# Patient Record
Sex: Male | Born: 1955 | Race: Black or African American | Hispanic: No | Marital: Married | Smoking: Former smoker
Health system: Southern US, Community
[De-identification: ages and names within clinical notes are randomized; demographics above are authoritative.]

## PROBLEM LIST (undated history)

## (undated) DIAGNOSIS — R0600 Dyspnea, unspecified: Secondary | ICD-10-CM

## (undated) DIAGNOSIS — I10 Essential (primary) hypertension: Secondary | ICD-10-CM

## (undated) DIAGNOSIS — I509 Heart failure, unspecified: Secondary | ICD-10-CM

## (undated) HISTORY — DX: Essential (primary) hypertension: I10

## (undated) HISTORY — PX: WISDOM TOOTH EXTRACTION: SHX21

---

## 2008-05-29 ENCOUNTER — Ambulatory Visit: Payer: Self-pay | Admitting: Diagnostic Radiology

## 2008-05-29 ENCOUNTER — Emergency Department (HOSPITAL_BASED_OUTPATIENT_CLINIC_OR_DEPARTMENT_OTHER): Admission: EM | Admit: 2008-05-29 | Discharge: 2008-05-29 | Payer: Self-pay | Admitting: Emergency Medicine

## 2010-08-15 LAB — DIFFERENTIAL
Eosinophils Relative: 2 % (ref 0–5)
Lymphocytes Relative: 16 % (ref 12–46)
Lymphs Abs: 1.3 10*3/uL (ref 0.7–4.0)

## 2010-08-15 LAB — CBC
HCT: 40.7 % (ref 39.0–52.0)
Hemoglobin: 13.3 g/dL (ref 13.0–17.0)
Platelets: 253 10*3/uL (ref 150–400)
WBC: 8.2 10*3/uL (ref 4.0–10.5)

## 2019-03-10 ENCOUNTER — Emergency Department (HOSPITAL_BASED_OUTPATIENT_CLINIC_OR_DEPARTMENT_OTHER): Payer: 59

## 2019-03-10 ENCOUNTER — Other Ambulatory Visit: Payer: Self-pay

## 2019-03-10 ENCOUNTER — Inpatient Hospital Stay (HOSPITAL_COMMUNITY): Payer: 59

## 2019-03-10 ENCOUNTER — Inpatient Hospital Stay (HOSPITAL_BASED_OUTPATIENT_CLINIC_OR_DEPARTMENT_OTHER)
Admission: EM | Admit: 2019-03-10 | Discharge: 2019-03-12 | DRG: 286 | Disposition: A | Discharge status: Home / Self Care | Payer: 59 | Attending: Internal Medicine | Admitting: Internal Medicine

## 2019-03-10 ENCOUNTER — Encounter (HOSPITAL_BASED_OUTPATIENT_CLINIC_OR_DEPARTMENT_OTHER): Payer: Self-pay | Admitting: *Deleted

## 2019-03-10 DIAGNOSIS — I429 Cardiomyopathy, unspecified: Secondary | ICD-10-CM | POA: Diagnosis present

## 2019-03-10 DIAGNOSIS — I509 Heart failure, unspecified: Secondary | ICD-10-CM | POA: Diagnosis not present

## 2019-03-10 DIAGNOSIS — I340 Nonrheumatic mitral (valve) insufficiency: Secondary | ICD-10-CM | POA: Diagnosis not present

## 2019-03-10 DIAGNOSIS — Z20828 Contact with and (suspected) exposure to other viral communicable diseases: Secondary | ICD-10-CM | POA: Diagnosis present

## 2019-03-10 DIAGNOSIS — I459 Conduction disorder, unspecified: Secondary | ICD-10-CM | POA: Diagnosis present

## 2019-03-10 DIAGNOSIS — I472 Ventricular tachycardia: Secondary | ICD-10-CM | POA: Diagnosis present

## 2019-03-10 DIAGNOSIS — I5021 Acute systolic (congestive) heart failure: Principal | ICD-10-CM | POA: Diagnosis present

## 2019-03-10 DIAGNOSIS — D509 Iron deficiency anemia, unspecified: Secondary | ICD-10-CM | POA: Diagnosis present

## 2019-03-10 DIAGNOSIS — I361 Nonrheumatic tricuspid (valve) insufficiency: Secondary | ICD-10-CM | POA: Diagnosis not present

## 2019-03-10 DIAGNOSIS — E43 Unspecified severe protein-calorie malnutrition: Secondary | ICD-10-CM | POA: Diagnosis present

## 2019-03-10 DIAGNOSIS — I493 Ventricular premature depolarization: Secondary | ICD-10-CM | POA: Diagnosis not present

## 2019-03-10 DIAGNOSIS — Z681 Body mass index (BMI) 19 or less, adult: Secondary | ICD-10-CM

## 2019-03-10 DIAGNOSIS — I5043 Acute on chronic combined systolic (congestive) and diastolic (congestive) heart failure: Secondary | ICD-10-CM | POA: Diagnosis not present

## 2019-03-10 DIAGNOSIS — F17210 Nicotine dependence, cigarettes, uncomplicated: Secondary | ICD-10-CM | POA: Diagnosis present

## 2019-03-10 DIAGNOSIS — Z880 Allergy status to penicillin: Secondary | ICD-10-CM | POA: Diagnosis not present

## 2019-03-10 DIAGNOSIS — I5041 Acute combined systolic (congestive) and diastolic (congestive) heart failure: Secondary | ICD-10-CM | POA: Diagnosis not present

## 2019-03-10 DIAGNOSIS — R1010 Upper abdominal pain, unspecified: Secondary | ICD-10-CM

## 2019-03-10 DIAGNOSIS — Z720 Tobacco use: Secondary | ICD-10-CM | POA: Diagnosis not present

## 2019-03-10 DIAGNOSIS — R109 Unspecified abdominal pain: Secondary | ICD-10-CM | POA: Diagnosis present

## 2019-03-10 HISTORY — DX: Dyspnea, unspecified: R06.00

## 2019-03-10 HISTORY — DX: Heart failure, unspecified: I50.9

## 2019-03-10 LAB — D-DIMER, QUANTITATIVE: D-Dimer, Quant: 2.02 ug/mL-FEU — ABNORMAL HIGH (ref 0.00–0.50)

## 2019-03-10 LAB — COMPREHENSIVE METABOLIC PANEL
ALT: 38 U/L (ref 0–44)
AST: 50 U/L — ABNORMAL HIGH (ref 15–41)
Albumin: 3.8 g/dL (ref 3.5–5.0)
Alkaline Phosphatase: 156 U/L — ABNORMAL HIGH (ref 38–126)
Anion gap: 10 (ref 5–15)
BUN: 11 mg/dL (ref 8–23)
CO2: 23 mmol/L (ref 22–32)
Calcium: 9.2 mg/dL (ref 8.9–10.3)
Chloride: 104 mmol/L (ref 98–111)
Creatinine, Ser: 0.89 mg/dL (ref 0.61–1.24)
GFR calc Af Amer: >60 mL/min (ref >60)
GFR calc non Af Amer: >60 mL/min (ref >60)
Glucose, Bld: 135 mg/dL — ABNORMAL HIGH (ref 70–99)
Potassium: 4.4 mmol/L (ref 3.5–5.1)
Sodium: 137 mmol/L (ref 135–145)
Total Bilirubin: 1.2 mg/dL (ref 0.3–1.2)
Total Protein: 7.4 g/dL (ref 6.5–8.1)

## 2019-03-10 LAB — CBC WITH DIFFERENTIAL/PLATELET
Abs Immature Granulocytes: 0.02 10*3/uL (ref 0.00–0.07)
Basophils Absolute: 0 10*3/uL (ref 0.0–0.1)
Basophils Relative: 0 %
Eosinophils Absolute: 0.1 10*3/uL (ref 0.0–0.5)
Eosinophils Relative: 2 %
HCT: 44.6 % (ref 39.0–52.0)
Hemoglobin: 14.3 g/dL (ref 13.0–17.0)
Immature Granulocytes: 0 %
Lymphocytes Relative: 29 %
Lymphs Abs: 2.2 10*3/uL (ref 0.7–4.0)
MCH: 31.3 pg (ref 26.0–34.0)
MCHC: 32.1 g/dL (ref 30.0–36.0)
MCV: 97.6 fL (ref 80.0–100.0)
Monocytes Absolute: 1.1 10*3/uL — ABNORMAL HIGH (ref 0.1–1.0)
Monocytes Relative: 14 %
Neutro Abs: 4.3 10*3/uL (ref 1.7–7.7)
Neutrophils Relative %: 55 %
Platelets: 201 10*3/uL (ref 150–400)
RBC: 4.57 MIL/uL (ref 4.22–5.81)
RDW: 13.1 % (ref 11.5–15.5)
WBC: 7.7 10*3/uL (ref 4.0–10.5)
nRBC: 0 % (ref 0.0–0.2)

## 2019-03-10 LAB — HIV ANTIBODY (ROUTINE TESTING W REFLEX): HIV Screen 4th Generation wRfx: NONREACTIVE

## 2019-03-10 LAB — LIPASE, BLOOD: Lipase: 26 U/L (ref 11–51)

## 2019-03-10 LAB — TROPONIN I (HIGH SENSITIVITY)
Troponin I (High Sensitivity): 17 ng/L (ref <18)
Troponin I (High Sensitivity): 19 ng/L — ABNORMAL HIGH (ref <18)

## 2019-03-10 LAB — ECHOCARDIOGRAM COMPLETE
Height: 72 in
Weight: 2139.2 oz

## 2019-03-10 LAB — SARS CORONAVIRUS 2 (TAT 6-24 HRS): SARS Coronavirus 2: NEGATIVE

## 2019-03-10 LAB — BRAIN NATRIURETIC PEPTIDE: B Natriuretic Peptide: 1080.2 pg/mL — ABNORMAL HIGH (ref 0.0–100.0)

## 2019-03-10 MED ORDER — ONDANSETRON HCL 4 MG PO TABS: 4.0000 mg | ORAL_TABLET | Freq: Four times a day (QID) | ORAL | Status: DC | PRN

## 2019-03-10 MED ORDER — FUROSEMIDE 10 MG/ML IJ SOLN
40.0000 mg | Freq: Two times a day (BID) | INTRAMUSCULAR | Status: DC
  Administered 2019-03-10 – 2019-03-11 (×2): 40 mg via INTRAVENOUS
  Filled 2019-03-10 (×2): qty 4

## 2019-03-10 MED ORDER — SODIUM CHLORIDE 0.9 % IV BOLUS
500.0000 mL | Freq: Once | INTRAVENOUS | Status: AC
  Administered 2019-03-10: 05:00:00 500 mL via INTRAVENOUS

## 2019-03-10 MED ORDER — SUCRALFATE 1 G PO TABS
1.0000 g | ORAL_TABLET | Freq: Once | ORAL | Status: DC
  Filled 2019-03-10: qty 1

## 2019-03-10 MED ORDER — ENOXAPARIN SODIUM 40 MG/0.4ML ~~LOC~~ SOLN
40.0000 mg | SUBCUTANEOUS | Status: DC
  Administered 2019-03-10 – 2019-03-11 (×2): 40 mg via SUBCUTANEOUS
  Filled 2019-03-10 (×3): qty 0.4

## 2019-03-10 MED ORDER — ONDANSETRON HCL 4 MG/2ML IJ SOLN: 4.0000 mg | Freq: Four times a day (QID) | INTRAMUSCULAR | Status: DC | PRN

## 2019-03-10 MED ORDER — IOHEXOL 350 MG/ML SOLN
100.0000 mL | Freq: Once | INTRAVENOUS | Status: AC | PRN
  Administered 2019-03-10: 07:00:00 100 mL via INTRAVENOUS

## 2019-03-10 MED ORDER — ALUM & MAG HYDROXIDE-SIMETH 200-200-20 MG/5ML PO SUSP
30.0000 mL | Freq: Once | ORAL | Status: AC
  Administered 2019-03-10: 30 mL via ORAL
  Filled 2019-03-10: qty 30

## 2019-03-10 MED ORDER — PNEUMOCOCCAL VAC POLYVALENT 25 MCG/0.5ML IJ INJ
0.5000 mL | INJECTION | INTRAMUSCULAR | Status: DC
  Filled 2019-03-10: qty 0.5

## 2019-03-10 MED ORDER — LORAZEPAM 2 MG/ML IJ SOLN
0.5000 mg | Freq: Once | INTRAMUSCULAR | Status: AC
  Administered 2019-03-10: 06:00:00 0.5 mg via INTRAVENOUS
  Filled 2019-03-10: qty 1

## 2019-03-10 MED ORDER — FUROSEMIDE 10 MG/ML IJ SOLN
40.0000 mg | Freq: Once | INTRAMUSCULAR | Status: AC
  Administered 2019-03-10: 40 mg via INTRAVENOUS
  Filled 2019-03-10: qty 4

## 2019-03-10 MED ORDER — MAGNESIUM SULFATE 2 GM/50ML IV SOLN
2.0000 g | Freq: Once | INTRAVENOUS | Status: AC
  Administered 2019-03-10: 11:00:00 2 g via INTRAVENOUS
  Filled 2019-03-10: qty 50

## 2019-03-10 MED ORDER — ENSURE ENLIVE PO LIQD
237.0000 mL | Freq: Two times a day (BID) | ORAL | Status: DC
  Administered 2019-03-11 – 2019-03-12 (×4): 237 mL via ORAL

## 2019-03-10 MED ORDER — POTASSIUM CHLORIDE CRYS ER 20 MEQ PO TBCR
20.0000 meq | EXTENDED_RELEASE_TABLET | Freq: Once | ORAL | Status: AC
  Administered 2019-03-10: 20 meq via ORAL
  Filled 2019-03-10: qty 1

## 2019-03-10 MED ORDER — SUCRALFATE 1 GM/10ML PO SUSP
ORAL | Status: AC
  Administered 2019-03-10: 1 g
  Filled 2019-03-10: qty 10

## 2019-03-10 NOTE — ED Notes (Addendum)
Pt's O2 sats were 88% on RA. MD aware and pt placed on O2 at 3l via n/c. No distress. O2 increased to 91%.

## 2019-03-10 NOTE — H&P (Signed)
[facility]                                                                                    Patient Demographics  Logan Lopez, is a 63 y.o. male  CSN: [id number]  MRN: [id number]  DOB - [birth year]  Admit Date - 03/10/2019  Outpatient Primary MD for the patient is Patient, No Pcp Per   With History of -  History reviewed. No pertinent past medical history.    History reviewed. No pertinent surgical history.  in for   Chief Complaint  Patient presents with  . abdominal pain     HPI  Logan Lopez  is a 63 y.o. male, with past medical history significant for smoking presenting with 4 to 5 days history of abdominal pain/epigastric.  Nausea of nausea vomiting or diarrhea.  Patient developed also orthopnea but no history of fever chills cough or chest pain.  Patient reports lower extremity edema and denies history of congestive heart failure and denies contact with Covid patients.  No history of alcohol abuse. Work-up in the emergency room showed pulmonary edema with small bilateral pleural effusions compatible with congestive heart failure and bibasilar airspace disease by chest x-ray, a BNP of 1080 and a flat troponin. His EKG shows sinus tach at 117 bpm with intraventricular conduction delay.    Review of Systems    In addition to the HPI above,  No Fever-chills, No Headache, No changes with Vision or hearing, No problems swallowing food or Liquids, No Chest pain,  No Nausea or Vommitting, Bowel movements are regular, No Blood in stool or Urine, No dysuria, No new skin rashes or bruises, No new joints pains-aches,  No new weakness, tingling, numbness in any extremity, No recent weight gain or loss, No polyuria, polydypsia or polyphagia, No significant Mental Stressors.  All systems were reviewed and were negative.   Social History Social History   Tobacco Use  . Smoking status: Current Every Day Smoker  . Smokeless tobacco: Never Used   Substance Use Topics  . Alcohol use: Yes    Comment: one day a week      Family History No family history on file.   Prior to Admission medications   Not on File    Allergies  Allergen Reactions  . Penicillins     Physical Exam  Vitals  Blood pressure (!) 147/97, pulse (!) 52, temperature 98.1 F (36.7 C), temperature source Oral, resp. rate (!) 21, height 6' (1.829 m), weight 60.6 kg, SpO2 97 %. General appearance, no acute distress, very pleasant HEENT no jaundice or pallor, no facial deviation or oral thrush Neck supple, no neck vein distention Chest clear and resonant  Heart normal S1-S2, positive for gallop Abdomen soft, nontender, bowel sounds present Extremities no clubbing cyanosis mild edema Neuro grossly nonfocal, pushing moving all extremities        Data Review  CBC Recent Labs  Lab 03/10/19 0513  WBC 7.7  HGB 14.3  HCT 44.6  PLT 201  MCV 97.6  MCH 31.3  MCHC 32.1  RDW 13.1  LYMPHSABS 2.2  MONOABS 1.1*  EOSABS 0.1  BASOSABS 0.0   ------------------------------------------------------------------------------------------------------------------  Chemistries  Recent Labs  Lab 03/10/19 0513  NA 137  K 4.4  CL 104  CO2 23  GLUCOSE 135*  BUN 11  CREATININE 0.89  CALCIUM 9.2  AST 50*  ALT 38  ALKPHOS 156*  BILITOT 1.2   ------------------------------------------------------------------------------------------------------------------ estimated creatinine clearance is 74.7 mL/min (by C-G formula based on SCr of 0.89 mg/dL). ------------------------------------------------------------------------------------------------------------------ No results for input(s): TSH, T4TOTAL, T3FREE, THYROIDAB in the last 72 hours.  Invalid input(s): FREET3   Coagulation profile No results for input(s): INR, PROTIME in the last 168  hours. ------------------------------------------------------------------------------------------------------------------- Recent Labs    03/10/19 0513  DDIMER 2.02*   -------------------------------------------------------------------------------------------------------------------  Cardiac Enzymes No results for input(s): CKMB, TROPONINI, MYOGLOBIN in the last 168 hours.  Invalid input(s): CK ------------------------------------------------------------------------------------------------------------------ Invalid input(s): POCBNP   ---------------------------------------------------------------------------------------------------------------  Urinalysis No results found for: COLORURINE, APPEARANCEUR, LABSPEC, PHURINE, GLUCOSEU, HGBUR, BILIRUBINUR, KETONESUR, PROTEINUR, UROBILINOGEN, NITRITE, LEUKOCYTESUR  ----------------------------------------------------------------------------------------------------------------  Imaging results:   Ct Angio Chest Pe W And/or Wo Contrast  Result Date: 03/10/2019 CLINICAL DATA:  Worsening upper abdominal pain over the past few weeks. Shortness of breath for 2 days. EXAM: CT ANGIOGRAPHY CHEST CT ABDOMEN AND PELVIS WITH CONTRAST TECHNIQUE: Multidetector CT imaging of the chest was performed using the standard protocol during bolus administration of intravenous contrast. Multiplanar CT image reconstructions and MIPs were obtained to evaluate the vascular anatomy. Multidetector CT imaging of the abdomen and pelvis was performed using the standard protocol during bolus administration of intravenous contrast. CONTRAST:  100 mL OMNIPAQUE IOHEXOL 350 MG/ML SOLN COMPARISON:  Single-view of the chest today. FINDINGS: CTA CHEST FINDINGS Cardiovascular: Satisfactory opacification of the pulmonary arteries to the segmental level. No evidence of pulmonary embolism. Marked cardiomegaly. No pericardial effusion. Mediastinum/Nodes: No enlarged mediastinal, hilar, or  axillary lymph nodes. Thyroid gland, trachea, and esophagus demonstrate no significant findings. Lungs/Pleura: Moderate bilateral pleural effusions. Ground-glass attenuation is seen throughout. No nodule, mass or consolidative process. Emphysematous change is seen in the apices. Musculoskeletal: No acute or focal abnormality. Review of the MIP images confirms the above findings. CT ABDOMEN and PELVIS FINDINGS Hepatobiliary: No focal liver abnormality is seen. No gallstones, gallbladder wall thickening, or biliary dilatation. Pancreas: Unremarkable. No pancreatic ductal dilatation or surrounding inflammatory changes. Spleen: Normal in size without focal abnormality. Adrenals/Urinary Tract: Adrenal glands are unremarkable. Kidneys are normal, without renal calculi, focal lesion, or hydronephrosis. Bladder is unremarkable. Stomach/Bowel: Stomach is within normal limits. Appendix appears normal. No evidence of bowel wall thickening, distention, or inflammatory changes. Vascular/Lymphatic: No significant vascular findings are present. No enlarged abdominal or pelvic lymph nodes. Reproductive: Prostate is unremarkable. Other: There is a small volume of free pelvic fluid. Musculoskeletal: No acute or focal bony abnormality. Review of the MIP images confirms the above findings. IMPRESSION: Ground-glass attenuation throughout both lungs is likely due to pulmonary edema in this patient with marked cardiomegaly and moderate bilateral pleural effusions. Negative for pulmonary embolus. Small volume of free pelvic fluid is nonspecific and could be due to volume overload or possibly enteritis. Bowel loops appear normal. Electronically Signed   By: Drusilla Kanner M.D.   On: 03/10/2019 07:50   Ct Abdomen Pelvis W Contrast  Result Date: 03/10/2019 CLINICAL DATA:  Worsening upper abdominal pain over the past few weeks. Shortness of breath for 2 days. EXAM: CT ANGIOGRAPHY CHEST CT ABDOMEN AND PELVIS WITH CONTRAST TECHNIQUE:  Multidetector CT imaging of the chest was performed using the standard protocol during bolus administration of intravenous contrast. Multiplanar CT image reconstructions and MIPs were obtained  to evaluate the vascular anatomy. Multidetector CT imaging of the abdomen and pelvis was performed using the standard protocol during bolus administration of intravenous contrast. CONTRAST:  100 mL OMNIPAQUE IOHEXOL 350 MG/ML SOLN COMPARISON:  Single-view of the chest today. FINDINGS: CTA CHEST FINDINGS Cardiovascular: Satisfactory opacification of the pulmonary arteries to the segmental level. No evidence of pulmonary embolism. Marked cardiomegaly. No pericardial effusion. Mediastinum/Nodes: No enlarged mediastinal, hilar, or axillary lymph nodes. Thyroid gland, trachea, and esophagus demonstrate no significant findings. Lungs/Pleura: Moderate bilateral pleural effusions. Ground-glass attenuation is seen throughout. No nodule, mass or consolidative process. Emphysematous change is seen in the apices. Musculoskeletal: No acute or focal abnormality. Review of the MIP images confirms the above findings. CT ABDOMEN and PELVIS FINDINGS Hepatobiliary: No focal liver abnormality is seen. No gallstones, gallbladder wall thickening, or biliary dilatation. Pancreas: Unremarkable. No pancreatic ductal dilatation or surrounding inflammatory changes. Spleen: Normal in size without focal abnormality. Adrenals/Urinary Tract: Adrenal glands are unremarkable. Kidneys are normal, without renal calculi, focal lesion, or hydronephrosis. Bladder is unremarkable. Stomach/Bowel: Stomach is within normal limits. Appendix appears normal. No evidence of bowel wall thickening, distention, or inflammatory changes. Vascular/Lymphatic: No significant vascular findings are present. No enlarged abdominal or pelvic lymph nodes. Reproductive: Prostate is unremarkable. Other: There is a small volume of free pelvic fluid. Musculoskeletal: No acute or focal bony  abnormality. Review of the MIP images confirms the above findings. IMPRESSION: Ground-glass attenuation throughout both lungs is likely due to pulmonary edema in this patient with marked cardiomegaly and moderate bilateral pleural effusions. Negative for pulmonary embolus. Small volume of free pelvic fluid is nonspecific and could be due to volume overload or possibly enteritis. Bowel loops appear normal. Electronically Signed   By: Inge Rise M.D.   On: 03/10/2019 07:50   Dg Chest Portable 1 View  Result Date: 03/10/2019 CLINICAL DATA:  Progressive upper abdominal pain over the last week. Shortness of breath. EXAM: PORTABLE CHEST 1 VIEW COMPARISON:  None. FINDINGS: Heart is enlarged. Mild edema is present. Small effusions are present bilaterally. Bibasilar airspace opacification is present. There is no other significant consolidation. The visualized soft tissues and bony thorax are unremarkable. IMPRESSION: 1. Cardiomegaly with mild edema and small bilateral pleural effusions compatible with congestive heart failure. 2. Bibasilar airspace disease likely reflects atelectasis. Infection is considered less likely. Electronically Signed   By: San Morelle M.D.   On: 03/10/2019 05:55    My personal review of EKG: Sinus tach at 117 bpm with intraventricular conduction delay  Assessment & Plan  Congestive heart failure/pulmonary edema Start IV Lasix Echocardiogram Accurate intake output Gallop by physical exam check echo    DVT Prophylaxis Lovenox  AM Labs Ordered, also please review Full Orders  Family Communication: Discussed with wife at bedside   code Status full  Disposition Plan: Home  Time spent in minutes : 42 minutes  Condition GUARDED   @SIGNATURE @

## 2019-03-10 NOTE — Progress Notes (Signed)
Pt was complaining of cramps to right groin and left foot briefly. Pt was able to move low extremities and get the leg and foot cramps relieved. MD notified.

## 2019-03-10 NOTE — Plan of Care (Signed)
Transfer from [facility]  Logan Lopez is a 63 year old male who presented with complaints of upper abdominal pain and subsequently shortness of breath.  Now requiring 3 L nasal cannula oxygen to maintain O2 saturations. BNP 1080.2. Imaging reveals cardiomegaly with edema. Patient started on Lasix 40 mg IV. Accepted telemetry bed as inpatient for CHF exacerbation.

## 2019-03-10 NOTE — Progress Notes (Signed)
  Echocardiogram 2D Echocardiogram has been performed.  Logan Lopez 03/10/2019, 3:51 PM

## 2019-03-10 NOTE — ED Provider Notes (Signed)
Signout from Dr. Dina Rich.  63 year old male here with upper abdominal pain and increased shortness of breath over the course of a few days.  Work-up significant for elevated BNP and chest x-ray showing pulmonary edema.  Given Lasix IV already. Physical Exam  BP 125/86   Pulse 81   Temp 98.7 F (37.1 C) (Oral)   Resp 16   Ht 6' (1.829 m)   Wt 70.3 kg   SpO2 100%   BMI 21.02 kg/m   Physical Exam  ED Course/Procedures     Procedures  MDM  Plan is to follow-up on his CT chest and CT abdomen and pelvis.  He will need admission to the medical service for further management of his new onset CHF.  CT shows some groundglass opacities consistent with pulmonary edema.  I have paged the hospitalist to discuss admission.  8:40 AM.  Discussed with Dr. Tamala Julian from [facility] who accepts the patient for admission, 9:15 AM.     Hayden Rasmussen, MD 03/10/19 [phone]

## 2019-03-10 NOTE — ED Triage Notes (Signed)
Pt c/o feeling upper abd pain for past week.states he did an e-visit and they thought he may have an ulcer. Pt feels that pain has gotten worse over the week. Describes as burning. States laying flat makes pain worse. C/o nausea. C/o having a little diarrhea. C/o sob that started 2 days ago. Worse when he lays back. Has not been eating or drinking well this week. Denies chest pain

## 2019-03-10 NOTE — ED Provider Notes (Addendum)
[facility] EM[facility]GENCY DEPARTMENT Provider Note   CSN: [id number] Arrival date & time: 03/10/19  [id number]     History   Chief Complaint Chief Complaint  Patient presents with  . abdominal pain    HPI CALOB BASKETTE is a 63 y.o. male.     HPI  This is a 63 year old male with a history of smoking who presents with abdominal pain and shortness of breath.  Patient reports 4 to 5-day history of abdominal pain.  He reports that it is still in mostly in the epigastric region.  It is worse with eating.  He has had nausea but no vomiting or diarrhea.  He had a telehealth visit and they were concerned for an ulcer.  He was started on medication but states "it made me feel funny."  Over the last 1 to 2 days he has developed shortness of breath.  Shortness of breath is worse with laying flat.  He is not had any fever, cough, or chest pain.  He has had some lower extremity swelling.  Denies any history of heart failure.  He is a current smoker.  Denies any recent sick contacts or Covid exposures.  Denies any loss of sense of taste or smell.  Patient denies any frequent alcohol use or anti-inflammatories.  History reviewed. No pertinent past medical history.  There are no active problems to display for this patient.   History reviewed. No pertinent surgical history.      Home Medications    Prior to Admission medications   Not on File    Family History No family history on file.  Social History Social History   Tobacco Use  . Smoking status: Current Every Day Smoker  . Smokeless tobacco: Never Used  Substance Use Topics  . Alcohol use: Yes    Comment: one day a week   . Drug use: Never     Allergies   Penicillins   Review of Systems Review of Systems  Constitutional: Negative for fever.  Respiratory: Positive for shortness of breath. Negative for cough and wheezing.   Cardiovascular: Positive for leg swelling. Negative for chest pain.  Gastrointestinal:  Positive for abdominal pain and nausea. Negative for constipation, diarrhea and vomiting.  Genitourinary: Negative for dysuria.  Skin: Negative for rash.  Neurological: Negative for headaches.  All other systems reviewed and are negative.    Physical Exam Updated Vital Signs BP 137/89 (BP Location: Right Arm)   Pulse (!) 122   Temp 98.7 F (37.1 C) (Oral)   Resp (!) 30   Ht 1.829 m (6')   Wt 70.3 kg   SpO2 93%   BMI 21.02 kg/m   Physical Exam Vitals signs and nursing note reviewed.  Constitutional:      Appearance: He is well-developed.     Comments: Ill-appearing but nontoxic  HENT:     Head: Normocephalic and atraumatic.     Mouth/Throat:     Mouth: Mucous membranes are dry.  Eyes:     Pupils: Pupils are equal, round, and reactive to light.  Neck:     Musculoskeletal: Neck supple.  Cardiovascular:     Rate and Rhythm: Regular rhythm. Tachycardia present.     Heart sounds: Normal heart sounds. No murmur.  Pulmonary:     Effort: Pulmonary effort is normal. No respiratory distress.     Breath sounds: Normal breath sounds. No wheezing.  Abdominal:     General: Bowel sounds are normal.     Palpations:  Abdomen is soft.     Tenderness: There is abdominal tenderness. There is no rebound.     Comments: Epigastric tenderness to palpation, no rebound or guarding  Musculoskeletal:     Right lower leg: No edema.     Left lower leg: No edema.  Lymphadenopathy:     Cervical: No cervical adenopathy.  Skin:    General: Skin is warm and dry.  Neurological:     Mental Status: He is alert and oriented to person, place, and time.  Psychiatric:     Comments: Anxious appearing      ED Treatments / Results  Labs (all labs ordered are listed, but only abnormal results are displayed) Labs Reviewed  CBC WITH DIFFERENTIAL/PLATELET - Abnormal; Notable for the following components:      Result Value   Monocytes Absolute 1.1 (*)    All other components within normal limits   SARS CORONAVIRUS 2 (TAT 6-24 HRS)  BRAIN NATRIURETIC PEPTIDE  COMPREHENSIVE METABOLIC PANEL  LIPASE, BLOOD  TROPONIN I (HIGH SENSITIVITY)    EKG EKG Interpretation  Date/Time:  Monday March 10 2019 05:01:30 EST Ventricular Rate:  117 PR Interval:    QRS Duration: 156 QT Interval:  408 QTC Calculation: 614 R Axis:   -86 Text Interpretation: Sinus tachycardia Right atrial enlargement Frequent PVCs LVH with IVCD and secondary repol abnrm Prolonged QT interval Diffuse T wave changes Confirmed by Ross Marcus ([id number]) on 03/10/2019 5:36:34 AM   Radiology No results found.  Procedures Procedures (including critical care time)  Medications Ordered in ED Medications  alum & mag hydroxide-simeth (MAALOX/MYLANTA) 200-200-20 MG/5ML suspension 30 mL (30 mLs Oral Given 03/10/19 0522)  sodium chloride 0.9 % bolus 500 mL (500 mLs Intravenous New Bag/Given 03/10/19 0521)  sucralfate (CARAFATE) 1 GM/10ML suspension (1 g  Given 03/10/19 0532)     Initial Impression / Assessment and Plan / ED Course  I have reviewed the triage vital signs and the nursing notes.  Pertinent labs & imaging results that were available during my care of the patient were reviewed by me and considered in my medical decision making (see chart for details).        Patient presents with shortness of breath and abdominal pain.  He is tachycardic and slightly tachypneic.  He is otherwise nontoxic-appearing.  He does appear anxious.  He is not febrile or hypotensive.  Given the positional nature of the shortness of breath, question whether he may have a pulmonary edema component.  However, no known history of heart failure.  Work-up initiated.  EKG shows significant ectopy with frequent PVCs.  Chest x-ray shows some cardiomegaly with mild edema suggestive of heart failure with elevated BNP.  Given his initial tachycardia, he was given a small fluid bolus as he did not appear volume overloaded.  After reviewing his  chest x-ray and lab work, he subsequently was given Lasix 40 mg.  He has been persistently tachycardic.  He was given some Ativan to address anxiety.  O2 sats have at times dipped into the mid 80s.  D-dimer was also positive.  For this reason, will obtain a CT to better characterize his full picture.  Additionally, will CT through the abdomen.  His lipase is normal and he has a slight elevation in AST and alkaline phosphatase.  Troponin is 17.  Patient will be signed out to oncoming provider. Final Clinical Impressions(s) / ED Diagnoses   Final diagnoses:  None    ED Discharge Orders    None  Shon Baton, MD 03/10/19 [id number]    Shon Baton, MD 03/10/19 [phone]

## 2019-03-10 NOTE — ED Notes (Signed)
Transported to CT 

## 2019-03-10 NOTE — ED Notes (Signed)
MD with pt  

## 2019-03-11 DIAGNOSIS — Z720 Tobacco use: Secondary | ICD-10-CM

## 2019-03-11 DIAGNOSIS — I472 Ventricular tachycardia: Secondary | ICD-10-CM

## 2019-03-11 DIAGNOSIS — I5041 Acute combined systolic (congestive) and diastolic (congestive) heart failure: Secondary | ICD-10-CM

## 2019-03-11 DIAGNOSIS — I493 Ventricular premature depolarization: Secondary | ICD-10-CM

## 2019-03-11 DIAGNOSIS — I5021 Acute systolic (congestive) heart failure: Principal | ICD-10-CM

## 2019-03-11 LAB — BASIC METABOLIC PANEL
Anion gap: 11 (ref 5–15)
BUN: 10 mg/dL (ref 8–23)
CO2: 27 mmol/L (ref 22–32)
Calcium: 9.3 mg/dL (ref 8.9–10.3)
Chloride: 102 mmol/L (ref 98–111)
Creatinine, Ser: 1.03 mg/dL (ref 0.61–1.24)
GFR calc Af Amer: >60 mL/min (ref >60)
GFR calc non Af Amer: >60 mL/min (ref >60)
Glucose, Bld: 105 mg/dL — ABNORMAL HIGH (ref 70–99)
Potassium: 4.2 mmol/L (ref 3.5–5.1)
Sodium: 140 mmol/L (ref 135–145)

## 2019-03-11 LAB — HEMOGLOBIN A1C
Hgb A1c MFr Bld: 6.1 % — ABNORMAL HIGH (ref 4.8–5.6)
Mean Plasma Glucose: 128.37 mg/dL

## 2019-03-11 LAB — MAGNESIUM: Magnesium: 2.1 mg/dL (ref 1.7–2.4)

## 2019-03-11 MED ORDER — ASPIRIN 81 MG PO CHEW
81.0000 mg | CHEWABLE_TABLET | ORAL | Status: AC
  Administered 2019-03-12: 81 mg via ORAL
  Filled 2019-03-11: qty 1

## 2019-03-11 MED ORDER — FUROSEMIDE 10 MG/ML IJ SOLN
40.0000 mg | Freq: Once | INTRAMUSCULAR | Status: AC
  Administered 2019-03-11: 40 mg via INTRAVENOUS
  Filled 2019-03-11: qty 4

## 2019-03-11 MED ORDER — AMIODARONE HCL IN DEXTROSE 360-4.14 MG/200ML-% IV SOLN
30.0000 mg/h | INTRAVENOUS | Status: DC
  Administered 2019-03-11 – 2019-03-12 (×2): 30 mg/h via INTRAVENOUS
  Filled 2019-03-11 (×2): qty 200

## 2019-03-11 MED ORDER — SODIUM CHLORIDE 0.9 % IV SOLN
INTRAVENOUS | Status: DC
  Administered 2019-03-12: 06:00:00 via INTRAVENOUS

## 2019-03-11 MED ORDER — ISOSORB DINITRATE-HYDRALAZINE 20-37.5 MG PO TABS
0.5000 | ORAL_TABLET | Freq: Three times a day (TID) | ORAL | Status: DC
  Administered 2019-03-11 – 2019-03-12 (×5): 0.5 via ORAL
  Filled 2019-03-11 (×5): qty 1

## 2019-03-11 MED ORDER — SODIUM CHLORIDE 0.9% FLUSH
3.0000 mL | Freq: Two times a day (BID) | INTRAVENOUS | Status: DC
  Administered 2019-03-11 (×2): 3 mL via INTRAVENOUS

## 2019-03-11 MED ORDER — AMIODARONE HCL IN DEXTROSE 360-4.14 MG/200ML-% IV SOLN
60.0000 mg/h | INTRAVENOUS | Status: AC
  Administered 2019-03-11: 60 mg/h via INTRAVENOUS
  Filled 2019-03-11: qty 200

## 2019-03-11 MED ORDER — SODIUM CHLORIDE 0.9% FLUSH: 3.0000 mL | INTRAVENOUS | Status: DC | PRN

## 2019-03-11 MED ORDER — SODIUM CHLORIDE 0.9 % IV SOLN: 250.0000 mL | INTRAVENOUS | Status: DC | PRN

## 2019-03-11 NOTE — Progress Notes (Signed)
PROGRESS NOTE    Logan Lopez  TGG:[id number] DOB: 11/03/1955 DOA: 03/10/2019 PCP: Patient, No Pcp Per   Brief Narrative:  Patient is a 63 year old male with no significant past medical history, smoker, who has not seen a physician for a while who presented with 4 to 5-day history of abdominal pain/epigastric pain, nausea, vomiting, orthopnea.  He also reported bilateral lower extremity edema.  Work-up in the emergency department showed pulmonary edema with small bilateral pleural effusion compatible with congestive heart failure, elevated BNP.  He was admitted for the management of possible acute congestive heart failure.  Echocardiogram done here showed ejection fraction around 25-30%.  Cardiology consulted.  Plan for right/left heart cath tomorrow.  Assessment & Plan:   Active Problems:   CHF exacerbation (HCC)   Congestive heart failure (CHF) (HCC)   Acute systolic CHF: Echocardiogram showed ejection fraction of 20 to 30%, grade 2 diastolic dysfunction.  Presented with volume overload with bilateral lower extremity edema. Elevated BNP. Pulmonary edema on chest x-ray.  Report of orthopnea.  Cardiology consulted and following.  Cardiology recommended to stop IV Lasix soon as he appears euvolemic. monitor input/output, daily weight.  Planning for right /left heart catheterization. Lower extremity edema has significantly improved.  His lungs are clear on auscultation. Plan to add BiDil.  He has been recommended LifeVest  Abdominal pain, nausea and vomiting: .  Abdomen pain,nausea,vomiting  have resolved.  PVCs: Noted to have 20-30 PVCs per hour.  Started on amnio drip.  Will monitor electrolytes  Tobacco abuse: Smokes almost 1 pack a day.  Counseled cessation.           DVT prophylaxis: Lovenox Code Status: Full code Family Communication: Discussed with wife on phone Disposition Plan: Home after cardiology clearance   Consultants: Cardiology  Procedures:  Echocardiogram  Antimicrobials:  Anti-infectives (From admission, onward)   None      Subjective: Patient seen and examined the bedside this morning.  Hemodynamically stable.  Comfortable.  He feels much better today.  Denies any abdomen pain, nausea or vomiting.  Denies any chest pain or shortness of breath.  Objective: Vitals:   03/11/19 0348 03/11/19 0820 03/11/19 1100 03/11/19 1336  BP: 136/77 114/86  99/85  Pulse: (!) 50 (!) 104  88  Resp: 20 18  18   Temp: 98.7 F (37.1 C) 98.3 F (36.8 C)  98.4 F (36.9 C)  TempSrc: Oral Oral    SpO2: 94% 100% 96% 99%  Weight:      Height:        Intake/Output Summary (Last 24 hours) at 03/11/2019 1433 Last data filed at 03/11/2019 7035 Gross per 24 hour  Intake 632.65 ml  Output 2375 ml  Net -1742.35 ml   Filed Weights   03/10/19 0453 03/10/19 1319 03/11/19 0118  Weight: 70.3 kg 60.6 kg 59.9 kg    Examination:  General exam: Appears calm and comfortable ,Not in distress, thin built HEENT:PERRL,Oral mucosa moist, Ear/Nose normal on gross exam Respiratory system: Bilateral equal air entry, normal vesicular breath sounds, no wheezes or crackles  Cardiovascular system: S1 & S2 heard, RRR. No JVD, murmurs, rubs, gallops or clicks. No pedal edema. Gastrointestinal system: Abdomen is nondistended, soft and nontender. No organomegaly or masses felt. Normal bowel sounds heard. Central nervous system: Alert and oriented. No focal neurological deficits. Extremities: No edema, no clubbing ,no cyanosis, distal peripheral pulses palpable. Skin: No rashes, lesions or ulcers,no icterus ,no pallor   Data Reviewed: I have personally reviewed following labs  and imaging studies  CBC: Recent Labs  Lab 03/10/19 0513  WBC 7.7  NEUTROABS 4.3  HGB 14.3  HCT 44.6  MCV 97.6  PLT 201   Basic Metabolic Panel: Recent Labs  Lab 03/10/19 0513 03/11/19 0424  NA 137 140  K 4.4 4.2  CL 104 102  CO2 23 27  GLUCOSE 135* 105*  BUN 11 10   CREATININE 0.89 1.03  CALCIUM 9.2 9.3  MG  --  2.1   GFR: Estimated Creatinine Clearance: 63.8 mL/min (by C-G formula based on SCr of 1.03 mg/dL). Liver Function Tests: Recent Labs  Lab 03/10/19 0513  AST 50*  ALT 38  ALKPHOS 156*  BILITOT 1.2  PROT 7.4  ALBUMIN 3.8   Recent Labs  Lab 03/10/19 0513  LIPASE 26   No results for input(s): AMMONIA in the last 168 hours. Coagulation Profile: No results for input(s): INR, PROTIME in the last 168 hours. Cardiac Enzymes: No results for input(s): CKTOTAL, CKMB, CKMBINDEX, TROPONINI in the last 168 hours. BNP (last 3 results) No results for input(s): PROBNP in the last 8760 hours. HbA1C: No results for input(s): HGBA1C in the last 72 hours. CBG: No results for input(s): GLUCAP in the last 168 hours. Lipid Profile: No results for input(s): CHOL, HDL, LDLCALC, TRIG, CHOLHDL, LDLDIRECT in the last 72 hours. Thyroid Function Tests: No results for input(s): TSH, T4TOTAL, FREET4, T3FREE, THYROIDAB in the last 72 hours. Anemia Panel: No results for input(s): VITAMINB12, FOLATE, FERRITIN, TIBC, IRON, RETICCTPCT in the last 72 hours. Sepsis Labs: No results for input(s): PROCALCITON, LATICACIDVEN in the last 168 hours.  Recent Results (from the past 240 hour(s))  SARS CORONAVIRUS 2 (TAT 6-24 HRS) Nasopharyngeal Nasopharyngeal Swab     Status: None   Collection Time: 03/10/19  5:34 AM   Specimen: Nasopharyngeal Swab  Result Value Ref Range Status   SARS Coronavirus 2 NEGATIVE NEGATIVE Final    Comment: (NOTE) SARS-CoV-2 target nucleic acids are NOT DETECTED. The SARS-CoV-2 RNA is generally detectable in upper and lower respiratory specimens during the acute phase of infection. Negative results do not preclude SARS-CoV-2 infection, do not rule out co-infections with other pathogens, and should not be used as the sole basis for treatment or other patient management decisions. Negative results must be combined with clinical  observations, patient history, and epidemiological information. The expected result is Negative. Fact Sheet for Patients: HairSlick.nohttps://www.fda.gov/media/138098/download Fact Sheet for Healthcare Providers: quierodirigir.comhttps://www.fda.gov/media/138095/download This test is not yet approved or cleared by the [place] FDA and  has been authorized for detection and/or diagnosis of SARS-CoV-2 by FDA under an Emergency Use Authorization (EUA). This EUA will remain  in effect (meaning this test can be used) for the duration of the COVID-19 declaration under Section 56 4(b)(1) of the Act, 21 U.S.C. section 360bbb-3(b)(1), unless the authorization is terminated or revoked sooner. Performed at [facility], [address] [place], [place] [address]          Radiology Studies: Ct Angio Chest Pe W And/or Wo Contrast  Result Date: 03/10/2019 CLINICAL DATA:  Worsening upper abdominal pain over the past few weeks. Shortness of breath for 2 days. EXAM: CT ANGIOGRAPHY CHEST CT ABDOMEN AND PELVIS WITH CONTRAST TECHNIQUE: Multidetector CT imaging of the chest was performed using the standard protocol during bolus administration of intravenous contrast. Multiplanar CT image reconstructions and MIPs were obtained to evaluate the vascular anatomy. Multidetector CT imaging of the abdomen and pelvis was performed using the standard protocol during  bolus administration of intravenous contrast. CONTRAST:  100 mL OMNIPAQUE IOHEXOL 350 MG/ML SOLN COMPARISON:  Single-view of the chest today. FINDINGS: CTA CHEST FINDINGS Cardiovascular: Satisfactory opacification of the pulmonary arteries to the segmental level. No evidence of pulmonary embolism. Marked cardiomegaly. No pericardial effusion. Mediastinum/Nodes: No enlarged mediastinal, hilar, or axillary lymph nodes. Thyroid gland, trachea, and esophagus demonstrate no significant findings. Lungs/Pleura: Moderate bilateral pleural effusions. Ground-glass attenuation is seen  throughout. No nodule, mass or consolidative process. Emphysematous change is seen in the apices. Musculoskeletal: No acute or focal abnormality. Review of the MIP images confirms the above findings. CT ABDOMEN and PELVIS FINDINGS Hepatobiliary: No focal liver abnormality is seen. No gallstones, gallbladder wall thickening, or biliary dilatation. Pancreas: Unremarkable. No pancreatic ductal dilatation or surrounding inflammatory changes. Spleen: Normal in size without focal abnormality. Adrenals/Urinary Tract: Adrenal glands are unremarkable. Kidneys are normal, without renal calculi, focal lesion, or hydronephrosis. Bladder is unremarkable. Stomach/Bowel: Stomach is within normal limits. Appendix appears normal. No evidence of bowel wall thickening, distention, or inflammatory changes. Vascular/Lymphatic: No significant vascular findings are present. No enlarged abdominal or pelvic lymph nodes. Reproductive: Prostate is unremarkable. Other: There is a small volume of free pelvic fluid. Musculoskeletal: No acute or focal bony abnormality. Review of the MIP images confirms the above findings. IMPRESSION: Ground-glass attenuation throughout both lungs is likely due to pulmonary edema in this patient with marked cardiomegaly and moderate bilateral pleural effusions. Negative for pulmonary embolus. Small volume of free pelvic fluid is nonspecific and could be due to volume overload or possibly enteritis. Bowel loops appear normal. Electronically Signed   By: Inge Rise M.D.   On: 03/10/2019 07:50   Ct Abdomen Pelvis W Contrast  Result Date: 03/10/2019 CLINICAL DATA:  Worsening upper abdominal pain over the past few weeks. Shortness of breath for 2 days. EXAM: CT ANGIOGRAPHY CHEST CT ABDOMEN AND PELVIS WITH CONTRAST TECHNIQUE: Multidetector CT imaging of the chest was performed using the standard protocol during bolus administration of intravenous contrast. Multiplanar CT image reconstructions and MIPs were  obtained to evaluate the vascular anatomy. Multidetector CT imaging of the abdomen and pelvis was performed using the standard protocol during bolus administration of intravenous contrast. CONTRAST:  100 mL OMNIPAQUE IOHEXOL 350 MG/ML SOLN COMPARISON:  Single-view of the chest today. FINDINGS: CTA CHEST FINDINGS Cardiovascular: Satisfactory opacification of the pulmonary arteries to the segmental level. No evidence of pulmonary embolism. Marked cardiomegaly. No pericardial effusion. Mediastinum/Nodes: No enlarged mediastinal, hilar, or axillary lymph nodes. Thyroid gland, trachea, and esophagus demonstrate no significant findings. Lungs/Pleura: Moderate bilateral pleural effusions. Ground-glass attenuation is seen throughout. No nodule, mass or consolidative process. Emphysematous change is seen in the apices. Musculoskeletal: No acute or focal abnormality. Review of the MIP images confirms the above findings. CT ABDOMEN and PELVIS FINDINGS Hepatobiliary: No focal liver abnormality is seen. No gallstones, gallbladder wall thickening, or biliary dilatation. Pancreas: Unremarkable. No pancreatic ductal dilatation or surrounding inflammatory changes. Spleen: Normal in size without focal abnormality. Adrenals/Urinary Tract: Adrenal glands are unremarkable. Kidneys are normal, without renal calculi, focal lesion, or hydronephrosis. Bladder is unremarkable. Stomach/Bowel: Stomach is within normal limits. Appendix appears normal. No evidence of bowel wall thickening, distention, or inflammatory changes. Vascular/Lymphatic: No significant vascular findings are present. No enlarged abdominal or pelvic lymph nodes. Reproductive: Prostate is unremarkable. Other: There is a small volume of free pelvic fluid. Musculoskeletal: No acute or focal bony abnormality. Review of the MIP images confirms the above findings. IMPRESSION: Ground-glass attenuation throughout both lungs is  likely due to pulmonary edema in this patient with  marked cardiomegaly and moderate bilateral pleural effusions. Negative for pulmonary embolus. Small volume of free pelvic fluid is nonspecific and could be due to volume overload or possibly enteritis. Bowel loops appear normal. Electronically Signed   By: Drusilla Kanner M.D.   On: 03/10/2019 07:50   Dg Chest Portable 1 View  Result Date: 03/10/2019 CLINICAL DATA:  Progressive upper abdominal pain over the last week. Shortness of breath. EXAM: PORTABLE CHEST 1 VIEW COMPARISON:  None. FINDINGS: Heart is enlarged. Mild edema is present. Small effusions are present bilaterally. Bibasilar airspace opacification is present. There is no other significant consolidation. The visualized soft tissues and bony thorax are unremarkable. IMPRESSION: 1. Cardiomegaly with mild edema and small bilateral pleural effusions compatible with congestive heart failure. 2. Bibasilar airspace disease likely reflects atelectasis. Infection is considered less likely. Electronically Signed   By: Marin Roberts M.D.   On: 03/10/2019 05:55        Scheduled Meds:  enoxaparin (LOVENOX) injection  40 mg Subcutaneous Q24H   feeding supplement (ENSURE ENLIVE)  237 mL Oral BID BM   isosorbide-hydrALAZINE  0.5 tablet Oral TID   pneumococcal 23 valent vaccine  0.5 mL Intramuscular Tomorrow-1000   sodium chloride flush  3 mL Intravenous Q12H   Continuous Infusions:  amiodarone 60 mg/hr (03/11/19 1102)   Followed by   amiodarone       LOS: 1 day    Time spent: 35 mins.More than 50% of that time was spent in counseling and/or coordination of care.      Burnadette Pop, MD [facility] Pager [phone]  If 7PM-7AM, please contact night-coverage [email or web address] Password [facility] 03/11/2019, 2:33 PM

## 2019-03-11 NOTE — Progress Notes (Signed)
Patient is having episodes of wide QRS, MD notified.

## 2019-03-11 NOTE — Progress Notes (Signed)
  Ordered faxed for Life Vest at discharge with frequent PVC/NSVT and low EF.   I called Life Vest Rep.   Amy Clegg NP-C  12:31 PM

## 2019-03-11 NOTE — Progress Notes (Signed)
  Amiodarone Drug - Drug Interaction Consult Note  Recommendations: -None - watch QTc if prn ondansetron used frequently (not administered at all yet)  Amiodarone is metabolized by the cytochrome P450 system and therefore has the potential to cause many drug interactions. Amiodarone has an average plasma half-life of 50 days (range 20 to 100 days).   There is potential for drug interactions to occur several weeks or months after stopping treatment and the onset of drug interactions may be slow after initiating amiodarone.   []  Statins: Increased risk of myopathy. Simvastatin- restrict dose to 20mg  daily. Other statins: counsel patients to report any muscle pain or weakness immediately.  []  Anticoagulants: Amiodarone can increase anticoagulant effect. Consider warfarin dose reduction. Patients should be monitored closely and the dose of anticoagulant altered accordingly, remembering that amiodarone levels take several weeks to stabilize.  []  Antiepileptics: Amiodarone can increase plasma concentration of phenytoin, the dose should be reduced. Note that small changes in phenytoin dose can result in large changes in levels. Monitor patient and counsel on signs of toxicity.  []  Beta blockers: increased risk of bradycardia, AV block and myocardial depression. Sotalol - avoid concomitant use.  []   Calcium channel blockers (diltiazem and verapamil): increased risk of bradycardia, AV block and myocardial depression.  []   Cyclosporine: Amiodarone increases levels of cyclosporine. Reduced dose of cyclosporine is recommended.  []  Digoxin dose should be halved when amiodarone is started.  []  Diuretics: increased risk of cardiotoxicity if hypokalemia occurs.  []  Oral hypoglycemic agents (glyburide, glipizide, glimepiride): increased risk of hypoglycemia. Patient's glucose levels should be monitored closely when initiating amiodarone therapy.   [x]  Drugs that prolong the QT interval:  Torsades de  pointes risk may be increased with concurrent use - avoid if possible.  Monitor QTc, also keep magnesium/potassium WNL if concurrent therapy can't be avoided. Marland Kitchen Antibiotics: e.g. fluoroquinolones, erythromycin. . Antiarrhythmics: e.g. quinidine, procainamide, disopyramide, sotalol. . Antipsychotics: e.g. phenothiazines, haloperidol.  . Lithium, tricyclic antidepressants, and methadone. Thank Concha Pyo  03/11/2019 11:09 AM

## 2019-03-11 NOTE — Progress Notes (Signed)
Patient is having frequents wide QRS's, NP Amy notified.

## 2019-03-11 NOTE — Consult Note (Addendum)
Advanced Heart Failure Team Consult Note   Primary Physician: Patient, No Pcp Per PCP-Cardiologist:  No primary care provider on file.  Reason for Consultation: Acute Systolic Heart Failure   HPI:    Logan Lopez is seen today for evaluation of new acute systolic heart failure  at the request of Dr Renford Dills.   [name] is a 63 year old with a history of iron deficient anemia, smoker ( 1 pack every 2 days ) and no other medical  Issues. No history of cardiac disease. Drinks wine coolers on the weekend only 5-6. 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   He has not been seen by PCP in years. About 1 week ago he had some abdominal discomfort. He had a telehealth and was started on protonix. His symptoms worsened and he developed shortness of breath. He had to sleep sitting up right or in a kneeling prosthion.  Says he has been light headed every now and then.   Presented to [facility] with increased shortness of breath. CXR showed showed bilateral effusions. CTA was negative PE. CT of abd- ground glass throughout both lungs possible due to pulmonary edema. Echo this admit showed EF 25-30% . He was started IV lasix. Negative > 3 liters oxygen. BNP on admit 1080, HS-Trop negative, creatinine 0.89.   Review of Systems: [y] = yes, [ ]  = no    General: Weight gain [ ] ; Weight loss [ ] ; Anorexia [ ] ; Fatigue [Y ]; Fever [ ] ; Chills [ ] ; Weakness [Y ]   Cardiac: Chest pain/pressure [ ] ; Resting SOB [ ] ; Exertional SOB [Y ]; Orthopnea [ ] ; Pedal Edema [ ] ; Palpitations [ ] ; Syncope [ ] ; Presyncope [ Y]; Paroxysmal nocturnal dyspnea[ ]    Pulmonary: Cough [ ] ; Wheezing[ ] ; Hemoptysis[ ] ; Sputum [ ] ; Snoring [ ]    GI: Vomiting[ ] ; Dysphagia[ ] ; Melena[ ] ; Hematochezia [ ] ; Heartburn[ ] ; Abdominal pain [ ] ; Constipation [ ] ; Diarrhea [ ] ; BRBPR [ ]    GU: Hematuria[ ] ; Dysuria [ ] ; Nocturia[ ]    Vascular: Pain in legs with walking [ ] ; Pain in feet with lying flat [ ] ;  Non-healing sores [ ] ; Stroke [ ] ; TIA [ ] ; Slurred speech [ ] ;   Neuro: Headaches[ ] ; Vertigo[ ] ; Seizures[ ] ; Paresthesias[ ] ;Blurred vision [ ] ; Diplopia [ ] ; Vision changes [ ]    Ortho/Skin: Arthritis [ ] ; Joint pain [ ] ; Muscle pain [ ] ; Joint swelling [ ] ; Back Pain [ ] ; Rash [ ]    Psych: Depression[ ] ; Anxiety[ ]    Heme: Bleeding problems [ ] ; Clotting disorders [ ] ; Anemia [ ]    Endocrine: Diabetes [ ] ; Thyroid dysfunction[ ]   Home Medications Prior to Admission medications   Medication Sig Start Date End Date Taking? Authorizing Provider  acetaminophen (TYLENOL) 500 MG tablet Take 1,000 mg by mouth every 6 (six) hours as needed for mild pain.   Yes [provider]  Chlorphen-Phenyleph-ASA (ALKA-SELTZER PLUS COLD) 2-7.8-325 MG TBEF Take 2 tablets by mouth every 6 (six) hours as needed (cold).   Yes [provider]  DM-Phenylephrine-Acetaminophen (THERAFLU EXPRESSMAX) 10-5-325 MG/15ML LIQD Take 15 mLs by mouth every 8 (eight) hours as needed (cold symptoms).   Yes [provider]  ferrous sulfate 325 (65 FE) MG tablet Take 325 mg by mouth daily with breakfast.   Yes [provider]  ibuprofen (ADVIL) 200 MG tablet Take 400 mg by mouth every 6 (six)  hours as needed for moderate pain.   Yes [provider]  pantoprazole (PROTONIX) 40 MG tablet Take 40 mg by mouth daily.   Yes [provider]  vitamin C (ASCORBIC ACID) 500 MG tablet Take 500 mg by mouth daily.   Yes [provider]    Past Medical History: Past Medical History:  Diagnosis Date   CHF (congestive heart failure) (HCC)    new 03/2019   Dyspnea     Past Surgical History: Past Surgical History:  Procedure Laterality Date   WISDOM TOOTH EXTRACTION      Family History: No family history of sudden cardiac death or coronary disease.  Social History: Social History   Socioeconomic History   Marital status: Married    Spouse name: Not on file    Number of children: Not on file   Years of education: Not on file   Highest education level: Not on file  Occupational History   Not on file  Social Needs   Financial resource strain: Not on file   Food insecurity    Worry: Not on file    Inability: Not on file   Transportation needs    Medical: Not on file    Non-medical: Not on file  Tobacco Use   Smoking status: Current Every Day Smoker    Years: 9.00    Types: Cigarettes   Smokeless tobacco: Never Used  Substance and Sexual Activity   Alcohol use: Yes    Comment: one day a week    Drug use: Never   Sexual activity: Not on file  Lifestyle   Physical activity    Days per week: Not on file    Minutes per session: Not on file   Stress: Not on file  Relationships   Social connections    Talks on phone: Not on file    Gets together: Not on file    Attends religious service: Not on file    Active member of club or organization: Not on file    Attends meetings of clubs or organizations: Not on file    Relationship status: Not on file  Other Topics Concern   Not on file  Social History Narrative   Not on file    Allergies:  Allergies  Allergen Reactions   Penicillins     Did it involve swelling of the face/tongue/throat, SOB, or low BP? No Did it involve sudden or severe rash/hives, skin peeling, or any reaction on the inside of your mouth or nose? yes Did you need to seek medical attention at a hospital or doctor's office? No When did it last happen? If all above answers are NO, may proceed with cephalosporin use.    Objective:    Vital Signs:   Temp:  [98 F (36.7 C)-98.7 F (37.1 C)] 98.3 F (36.8 C) (11/10 0820) Pulse Rate:  [46-104] 104 (11/10 0820) Resp:  [18-21] 18 (11/10 0820) BP: (102-150)/(71-103) 114/86 (11/10 0820) SpO2:  [94 %-100 %] 100 % (11/10 0820) Weight:  [59.9 kg-60.6 kg] 59.9 kg (11/10 0118) Last BM Date: 03/10/19  Weight change: Filed Weights   03/10/19  0453 03/10/19 1319 03/11/19 0118  Weight: 70.3 kg 60.6 kg 59.9 kg    Intake/Output:   Intake/Output Summary (Last 24 hours) at 03/11/2019 0948 Last data filed at 03/11/2019 0823 Gross per 24 hour  Intake 772.65 ml  Output 2950 ml  Net -2177.35 ml      Physical Exam    General:  Thin sitting on the side of the bed. No resp difficulty HEENT: normal Neck: supple. JVP 5-6 . Carotids 2+ bilat; no bruits. No lymphadenopathy or thyromegaly appreciated. Cor: PMI nondisplaced. Irregular rate & rhythm. No rubs, gallops or murmurs. Lungs: clear Abdomen: soft, nontender, nondistended. No hepatosplenomegaly. No bruits or masses. Good bowel sounds. Extremities: no cyanosis, clubbing, rash, edema.  Neuro: alert & orientedx3, cranial nerves grossly intact. moves all 4 extremities w/o difficulty. Affect pleasant   Telemetry   NSR with frequent PVCs > 20-30 per minutes.   EKG    Sinus Tach 117 frequent PVCs. QRS 156 ms.   Labs   Basic Metabolic Panel: Recent Labs  Lab 03/10/19 0513 03/11/19 0424  NA 137 140  K 4.4 4.2  CL 104 102  CO2 23 27  GLUCOSE 135* 105*  BUN 11 10  CREATININE 0.89 1.03  CALCIUM 9.2 9.3    Liver Function Tests: Recent Labs  Lab 03/10/19 0513  AST 50*  ALT 38  ALKPHOS 156*  BILITOT 1.2  PROT 7.4  ALBUMIN 3.8   Recent Labs  Lab 03/10/19 0513  LIPASE 26   No results for input(s): AMMONIA in the last 168 hours.  CBC: Recent Labs  Lab 03/10/19 0513  WBC 7.7  NEUTROABS 4.3  HGB 14.3  HCT 44.6  MCV 97.6  PLT 201    Cardiac Enzymes: No results for input(s): CKTOTAL, CKMB, CKMBINDEX, TROPONINI in the last 168 hours.  BNP: BNP (last 3 results) Recent Labs    03/10/19 0513  BNP 1,080.2*    ProBNP (last 3 results) No results for input(s): PROBNP in the last 8760 hours.   CBG: No results for input(s): GLUCAP in the last 168 hours.  Coagulation Studies: No results for input(s): LABPROT, INR in the last 72 hours.   Imaging      No results found.   Medications:     Current Medications:  enoxaparin (LOVENOX) injection  40 mg Subcutaneous Q24H   feeding supplement (ENSURE ENLIVE)  237 mL Oral BID BM   furosemide  40 mg Intravenous Q12H   pneumococcal 23 valent vaccine  0.5 mL Intramuscular Tomorrow-1000     Infusions:      Assessment/Plan   1. Acute Systolic Heart Failure ECHO this admit 25-30%. HS Trop was not elevated. No cardiac history. No family history of heart disease.  - Will need cath to further evaluate. Set up LHC/RHC for tomorrow. This could possibly be from high PVC burden.  - Volume status improved. Stop IV lasix. Appears  Euvolemic.  - Hold diuretics until and see what RHC shows. - Doubt he will need daily diuretics. 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    - Anticipate adding BIDIL tomorrow. I dont think entresto would be the best option for afterload.  - Hold off on bb. Adding amio for PVC suppression.   2. PVC 20-30 PVCs per hour.  Start amio drip to suppress PVCs. Check Mag. Keep K >4.   3. Tobacco Abuse.  Discussed smoking cessation.   Set RHC/LHC for tomorrow. Start on amiodarone drip to suppress PVCs.  Consult cardiac rehab.   Length of Stay: 1  Amy Clegg, NP  03/11/2019, 9:48 AM  Advanced Heart Failure Team Pager [phone] (M-F; 7a - 4p)  Please contact [facility] for night-coverage after hours (4p -7a ) and weekends on [email or web address]  Patient seen with NP, agree with the  above note.   No prior cardiac history.  He smokes about 1/2 ppd and has a history of Fe deficiency anemia.  Not a heavy drinker.  No FH of cardiomyopathy.  About a week ago, he developed progressive exertional dyspnea and orthopnea, no chest pain. He was admitted with evidence for CHF.  Echo showed EF 25-30% with severe LV dilation and normal RV.  Telemetry shows very frequent PVCs and runs of NSVT.  He has diuresed  well so far with IV Lasix.   General: NAD Neck: JVP 9-10 cm, no thyromegaly or thyroid nodule.  Lungs: Clear to auscultation bilaterally with normal respiratory effort. CV: Lateral PMI.  Heart regular S1/S2, no S3/S4, no murmur.  No peripheral edema.  No carotid bruit.  Normal pedal pulses.  Abdomen: Soft, nontender, no hepatosplenomegaly, no distention.  Skin: Intact without lesions or rashes.  Neurologic: Alert and oriented x 3.  Psych: Normal affect. Extremities: No clubbing or cyanosis.  HEENT: Normal.   1. Acute systolic CHF: Newly noted cardiomyopathy with severe LV dilation, EF 25-30%, RV ok.  He was admitted with 1 week of exertional dyspnea/orthopnea, has diuresed well here and feels better.  No family history of cardiomyopathy.  No history of CAD or chest pain.  No definite pre-existing viral symptoms.  It is possible that this could be a PVC-mediated CMP (or that the cardiomyopathy itself could be driving the PVCs).  On exam, he remains mildly volume overloaded.  - Lasix 40 mg IV one more dose this evening, probably to po tomorrow.  - Add Bidil 1/2 tab tid.  - Suppress PVCs/NSVT => amiodarone gtt, see below.  - Spironolactone if tolerates the above with no problems.  - Will arrange for RHC/LHC tomorrow to rule out CAD and assess filling pressures/CO.  2. PVCs/NSVT: Very frequent.  ?Cause of cardiomyopathy versus result of cardiomyopathy.   - Amiodarone gtt for now.  - Will need Lifevest at discharge.  - Keep up K and Mg.  3. Smoking: I strongly recommended that he quit.   Marca AnconaDalton Shelbia Scinto 03/11/2019 12:05 PM

## 2019-03-11 NOTE — H&P (View-Only) (Signed)
Advanced Heart Failure Team Consult Note   Primary Physician: Logan Lopez, No Pcp Per PCP-Cardiologist:  No primary care provider on file.  Reason for Consultation: Acute Systolic Heart Failure   HPI:    Logan Lopez is seen today for evaluation of new acute systolic heart failure  at the request of Dr Renford Dills.   Logan Lopez is a 63 year old with a history of iron deficient anemia, smoker ( 1 pack every 2 days ) and no other medical  Issues. No history of cardiac disease. Drinks wine coolers on the weekend only 5-6. 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   He has not been seen by PCP in years. About 1 week ago he had some abdominal discomfort. He had a telehealth and was started on protonix. His symptoms worsened and he developed shortness of breath. He had to sleep sitting up right or in a kneeling prosthion.  Says he has been light headed every now and then.   Presented to [facility] with increased shortness of breath. CXR showed showed bilateral effusions. CTA was negative PE. CT of abd- ground glass throughout both lungs possible due to pulmonary edema. Echo this admit showed EF 25-30% . He was started IV lasix. Negative > 3 liters oxygen. BNP on admit 1080, HS-Trop negative, creatinine 0.89.   Review of Systems: [y] = yes, [ ]  = no    General: Weight gain [ ] ; Weight loss [ ] ; Anorexia [ ] ; Fatigue [Y ]; Fever [ ] ; Chills [ ] ; Weakness [Y ]   Cardiac: Chest pain/pressure [ ] ; Resting SOB [ ] ; Exertional SOB [Y ]; Orthopnea [ ] ; Pedal Edema [ ] ; Palpitations [ ] ; Syncope [ ] ; Presyncope [ Y]; Paroxysmal nocturnal dyspnea[ ]    Pulmonary: Cough [ ] ; Wheezing[ ] ; Hemoptysis[ ] ; Sputum [ ] ; Snoring [ ]    GI: Vomiting[ ] ; Dysphagia[ ] ; Melena[ ] ; Hematochezia [ ] ; Heartburn[ ] ; Abdominal pain [ ] ; Constipation [ ] ; Diarrhea [ ] ; BRBPR [ ]    GU: Hematuria[ ] ; Dysuria [ ] ; Nocturia[ ]    Vascular: Pain in legs with walking [ ] ; Pain in feet with lying flat [ ] ;  Non-healing sores [ ] ; Stroke [ ] ; TIA [ ] ; Slurred speech [ ] ;   Neuro: Headaches[ ] ; Vertigo[ ] ; Seizures[ ] ; Paresthesias[ ] ;Blurred vision [ ] ; Diplopia [ ] ; Vision changes [ ]    Ortho/Skin: Arthritis [ ] ; Joint pain [ ] ; Muscle pain [ ] ; Joint swelling [ ] ; Back Pain [ ] ; Rash [ ]    Psych: Depression[ ] ; Anxiety[ ]    Heme: Bleeding problems [ ] ; Clotting disorders [ ] ; Anemia [ ]    Endocrine: Diabetes [ ] ; Thyroid dysfunction[ ]   Home Medications Prior to Admission medications   Medication Sig Start Date End Date Taking? Authorizing Provider  acetaminophen (TYLENOL) 500 MG tablet Take 1,000 mg by mouth every 6 (six) hours as needed for mild pain.   Yes [provider]  Chlorphen-Phenyleph-ASA (ALKA-SELTZER PLUS COLD) 2-7.8-325 MG TBEF Take 2 tablets by mouth every 6 (six) hours as needed (cold).   Yes [provider]  DM-Phenylephrine-Acetaminophen (THERAFLU EXPRESSMAX) 10-5-325 MG/15ML LIQD Take 15 mLs by mouth every 8 (eight) hours as needed (cold symptoms).   Yes [provider]  ferrous sulfate 325 (65 FE) MG tablet Take 325 mg by mouth daily with breakfast.   Yes [provider]  ibuprofen (ADVIL) 200 MG tablet Take 400 mg by mouth every 6 (six)  hours as needed for moderate pain.   Yes [provider]  pantoprazole (PROTONIX) 40 MG tablet Take 40 mg by mouth daily.   Yes [provider]  vitamin C (ASCORBIC ACID) 500 MG tablet Take 500 mg by mouth daily.   Yes [provider]    Past Medical History: Past Medical History:  Diagnosis Date   CHF (congestive heart failure) (HCC)    new 03/2019   Dyspnea     Past Surgical History: Past Surgical History:  Procedure Laterality Date   WISDOM TOOTH EXTRACTION      Family History: No family history of sudden cardiac death or coronary disease.  Social History: Social History   Socioeconomic History   Marital status: Married    Spouse name: Not on file    Number of children: Not on file   Years of education: Not on file   Highest education level: Not on file  Occupational History   Not on file  Social Needs   Financial resource strain: Not on file   Food insecurity    Worry: Not on file    Inability: Not on file   Transportation needs    Medical: Not on file    Non-medical: Not on file  Tobacco Use   Smoking status: Current Every Day Smoker    Years: 9.00    Types: Cigarettes   Smokeless tobacco: Never Used  Substance and Sexual Activity   Alcohol use: Yes    Comment: one day a week    Drug use: Never   Sexual activity: Not on file  Lifestyle   Physical activity    Days per week: Not on file    Minutes per session: Not on file   Stress: Not on file  Relationships   Social connections    Talks on phone: Not on file    Gets together: Not on file    Attends religious service: Not on file    Active member of club or organization: Not on file    Attends meetings of clubs or organizations: Not on file    Relationship status: Not on file  Other Topics Concern   Not on file  Social History Narrative   Not on file    Allergies:  Allergies  Allergen Reactions   Penicillins     Did it involve swelling of the face/tongue/throat, SOB, or low BP? No Did it involve sudden or severe rash/hives, skin peeling, or any reaction on the inside of your mouth or nose? yes Did you need to seek medical attention at a hospital or doctor's office? No When did it last happen? If all above answers are NO, may proceed with cephalosporin use.    Objective:    Vital Signs:   Temp:  [98 F (36.7 C)-98.7 F (37.1 C)] 98.3 F (36.8 C) (11/10 0820) Pulse Rate:  [46-104] 104 (11/10 0820) Resp:  [18-21] 18 (11/10 0820) BP: (102-150)/(71-103) 114/86 (11/10 0820) SpO2:  [94 %-100 %] 100 % (11/10 0820) Weight:  [59.9 kg-60.6 kg] 59.9 kg (11/10 0118) Last BM Date: 03/10/19  Weight change: Filed Weights   03/10/19  0453 03/10/19 1319 03/11/19 0118  Weight: 70.3 kg 60.6 kg 59.9 kg    Intake/Output:   Intake/Output Summary (Last 24 hours) at 03/11/2019 0948 Last data filed at 03/11/2019 0823 Gross per 24 hour  Intake 772.65 ml  Output 2950 ml  Net -2177.35 ml      Physical Exam    General:  Thin sitting on the side of the bed. No resp difficulty HEENT: normal Neck: supple. JVP 5-6 . Carotids 2+ bilat; no bruits. No lymphadenopathy or thyromegaly appreciated. Cor: PMI nondisplaced. Irregular rate & rhythm. No rubs, gallops or murmurs. Lungs: clear Abdomen: soft, nontender, nondistended. No hepatosplenomegaly. No bruits or masses. Good bowel sounds. Extremities: no cyanosis, clubbing, rash, edema.  Neuro: alert & orientedx3, cranial nerves grossly intact. moves all 4 extremities w/o difficulty. Affect pleasant   Telemetry   NSR with frequent PVCs > 20-30 per minutes.   EKG    Sinus Tach 117 frequent PVCs. QRS 156 ms.   Labs   Basic Metabolic Panel: Recent Labs  Lab 03/10/19 0513 03/11/19 0424  NA 137 140  K 4.4 4.2  CL 104 102  CO2 23 27  GLUCOSE 135* 105*  BUN 11 10  CREATININE 0.89 1.03  CALCIUM 9.2 9.3    Liver Function Tests: Recent Labs  Lab 03/10/19 0513  AST 50*  ALT 38  ALKPHOS 156*  BILITOT 1.2  PROT 7.4  ALBUMIN 3.8   Recent Labs  Lab 03/10/19 0513  LIPASE 26   No results for input(s): AMMONIA in the last 168 hours.  CBC: Recent Labs  Lab 03/10/19 0513  WBC 7.7  NEUTROABS 4.3  HGB 14.3  HCT 44.6  MCV 97.6  PLT 201    Cardiac Enzymes: No results for input(s): CKTOTAL, CKMB, CKMBINDEX, TROPONINI in the last 168 hours.  BNP: BNP (last 3 results) Recent Labs    03/10/19 0513  BNP 1,080.2*    ProBNP (last 3 results) No results for input(s): PROBNP in the last 8760 hours.   CBG: No results for input(s): GLUCAP in the last 168 hours.  Coagulation Studies: No results for input(s): LABPROT, INR in the last 72 hours.   Imaging      No results found.   Medications:     Current Medications:  enoxaparin (LOVENOX) injection  40 mg Subcutaneous Q24H   feeding supplement (ENSURE ENLIVE)  237 mL Oral BID BM   furosemide  40 mg Intravenous Q12H   pneumococcal 23 valent vaccine  0.5 mL Intramuscular Tomorrow-1000     Infusions:      Assessment/Plan   1. Acute Systolic Heart Failure ECHO this admit 25-30%. HS Trop was not elevated. No cardiac history. No family history of heart disease.  - Will need cath to further evaluate. Set up LHC/RHC for tomorrow. This could possibly be from high PVC burden.  - Volume status improved. Stop IV lasix. Appears  Euvolemic.  - Hold diuretics until and see what RHC shows. - Doubt he will need daily diuretics. 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    - Anticipate adding BIDIL tomorrow. I dont think entresto would be the best option for afterload.  - Hold off on bb. Adding amio for PVC suppression.   2. PVC 20-30 PVCs per hour.  Start amio drip to suppress PVCs. Check Mag. Keep K >4.   3. Tobacco Abuse.  Discussed smoking cessation.   Set RHC/LHC for tomorrow. Start on amiodarone drip to suppress PVCs.  Consult cardiac rehab.   Length of Stay: 1  Amy Clegg, NP  03/11/2019, 9:48 AM  Advanced Heart Failure Team Pager [phone] (M-F; 7a - 4p)  Please contact [facility] for night-coverage after hours (4p -7a ) and weekends on [email or web address]  Logan Lopez seen with NP, agree with the  above note.  ° °No prior cardiac history.  He smokes about 1/2 ppd and has a history of Fe deficiency anemia.  Not a heavy drinker.  No FH of cardiomyopathy.  About a week ago, he developed progressive exertional dyspnea and orthopnea, no chest pain. He was admitted with evidence for CHF.  Echo showed EF 25-30% with severe LV dilation and normal RV.  Telemetry shows very frequent PVCs and runs of NSVT.  He has diuresed  well so far with IV Lasix.  ° °General: NAD °Neck: JVP 9-10 cm, no thyromegaly or thyroid nodule.  °Lungs: Clear to auscultation bilaterally with normal respiratory effort. °CV: Lateral PMI.  Heart regular S1/S2, no S3/S4, no murmur.  No peripheral edema.  No carotid bruit.  Normal pedal pulses.  °Abdomen: Soft, nontender, no hepatosplenomegaly, no distention.  °Skin: Intact without lesions or rashes.  °Neurologic: Alert and oriented x 3.  °Psych: Normal affect. °Extremities: No clubbing or cyanosis.  °HEENT: Normal.  ° °1. Acute systolic CHF: Newly noted cardiomyopathy with severe LV dilation, EF 25-30%, RV ok.  He was admitted with 1 week of exertional dyspnea/orthopnea, has diuresed well here and feels better.  No family history of cardiomyopathy.  No history of CAD or chest pain.  No definite pre-existing viral symptoms.  It is possible that this could be a PVC-mediated CMP (or that the cardiomyopathy itself could be driving the PVCs).  On exam, he remains mildly volume overloaded.  °- Lasix 40 mg IV one more dose this evening, probably to po tomorrow.  °- Add Bidil 1/2 tab tid.  °- Suppress PVCs/NSVT => amiodarone gtt, see below.  °- Spironolactone if tolerates the above with no problems.  °- Will arrange for RHC/LHC tomorrow to rule out CAD and assess filling pressures/CO.  °2. PVCs/NSVT: Very frequent.  ?Cause of cardiomyopathy versus result of cardiomyopathy.   °- Amiodarone gtt for now.  °- Will need Lifevest at discharge.  °- Keep up K and Mg.  °3. Smoking: I strongly recommended that he quit.  ° °Nieves Chapa °03/11/2019 °12:05 PM ° ° °

## 2019-03-11 NOTE — Progress Notes (Addendum)
Paged cardiology regarding pt BP of 95/59 with Bidil scheduled tonight. Cardiology returned page and even with parameters set for SBP less than 90, stated to wait until 11pm to recheck BP given the last dose was given at 1714.   If SBP is greater than 100 okay to give, if not, page back.   Repeat BP at 2310 is 97/64.  Notified cardiology who stated to give the dose of Bidil.

## 2019-03-11 NOTE — Progress Notes (Signed)
[id number] Did not walk with pt due to ectopy and wanted to see cath results first.  CHF education with pt and wife who voiced understanding. Gave CHF booklet and low sodium diets. Discussed when to call MD with signs of CHF, importance of daily weights and 2L FR and 2000 mg sodium restrictions. Gave low sodium diets. Will follow up after cath results. Graylon Good RN BSN 03/11/2019 3:16 PM

## 2019-03-11 NOTE — Progress Notes (Signed)
Initial Nutrition Assessment  DOCUMENTATION CODES:   Severe malnutrition in context of chronic illness, Underweight  INTERVENTION:   -Continue Ensure Enlive po BID, each supplement provides 350 kcal and 20 grams of protein -Magic cup BID with meals, each supplement provides 290 kcal and 9 grams of protein -MVI with minerals daily  NUTRITION DIAGNOSIS:   Severe Malnutrition related to chronic illness(CHF) as evidenced by energy intake < 75% for > or equal to 1 month, moderate fat depletion, severe fat depletion, moderate muscle depletion, severe muscle depletion.  GOAL:   Patient will meet greater than or equal to 90% of their needs  MONITOR:   PO intake, Supplement acceptance, Weight trends, Skin, I & O's  REASON FOR ASSESSMENT:   Malnutrition Screening Tool    ASSESSMENT:   Logan Lopez  is a 63 y.o. male, with past medical history significant for smoking presenting with 4 to 5 days history of abdominal pain/epigastric.  Nausea of nausea vomiting or diarrhea.  Patient developed also orthopnea but no history of fever chills cough or chest pain.  Patient reports lower extremity edema and denies history of congestive heart failure and denies contact with Covid patients.  No history of alcohol abuse.  Pt admitted with CHF and pulmonary edema.   Reviewed I/O's: -3.8 L x 24 hours and -4.3 L since admission  UOP: 4.6 L x 24 hours  Per cardiology notes, plan for RHC/LHC tomorrow to rule out CAD and assess filling pressures/CO.   Spoke with pt and wife. Pt wife reports that pt has "never been a big eater", however, intake has declined over the past 9 months after pt transitioned from working from first shift to second shift. Due to shift changes, pt has very erratic eating patterns. Per pt, he typically eats breakfast around 7 AM (eggs, bacon, and grits). He often does not take breaks when he is at works and consumes water and non-perishable snacks such as chips and cookies during  his short breaks. Wife reports that she would try to prepare sandwiches for pt, however, he often wound not eat them. On his day off, pt would typically only eat one meal per day (collard greens, fried chicken, and mac and cheese). Pt's intake has been variable during hospitalization (meal completion 15-100%). Pt is disgruntled that he is unable to eat salt, however, is understanding of sodium and fluid restriction.  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 His usual weight is around 145-150# and he has always had a slender frame. Pt and wife estimate that she has lost about 15-20# over the past 6 months, which has been gradual.   Discussed with pt and wife importance of good meal and supplement intake to promote healing. Pt is amenable to consume Ensure supplement (has already consumed two daily). Discussed importance of compliance of low sodium and fluid restrictions.   Labs reviewed.   NUTRITION - FOCUSED PHYSICAL EXAM:    Most Recent Value  Orbital Region  Severe depletion  Upper Arm Region  Severe depletion  Thoracic and Lumbar Region  Moderate depletion  Buccal Region  Severe depletion  Temple Region  Severe depletion  Clavicle Bone Region  Severe depletion  Clavicle and Acromion Bone Region  Severe depletion  Scapular Bone Region  Severe depletion  Dorsal Hand  Severe depletion  Patellar Region  Severe depletion  Anterior Thigh Region  Moderate depletion  Posterior Calf  Region  Severe depletion  Edema (RD Assessment)  None  Hair  Reviewed  Eyes  Reviewed  Mouth  Reviewed  Skin  Reviewed  Nails  Reviewed       Diet Order:   Diet Order            Diet NPO time specified Except for: Sips with Meds  Diet effective midnight              EDUCATION NEEDS:   Education needs have been addressed  Skin:  Skin Assessment: Reviewed RN Assessment  Last BM:  03/10/19  Height:   Ht Readings from  Last 1 Encounters:  03/10/19 6' (1.829 m)    Weight:   Wt Readings from Last 1 Encounters:  03/12/19 60.1 kg    Ideal Body Weight:  80.9 kg  BMI:  Body mass index is 17.97 kg/m.  Estimated Nutritional Needs:   Kcal:  2100-2300  Protein:  105-120 grams  Fluid:  1.5 L    Logan Lopez, RD, LDN, [place] Registered Dietitian II Certified Diabetes Care and Education Specialist Pager: [phone] After hours Pager: [phone]

## 2019-03-11 NOTE — TOC Progression Note (Addendum)
Transition of Care [facility]) - Progression Note    Patient Details  Name: Logan Lopez MRN: [id number] Date of Birth: 08/03/1955  Transition of Care [facility]) CM/SW Contact  Zenon Mayo, RN Phone Number: 03/11/2019, 1:51 PM  Clinical Narrative:    Patient needs life vest, NCM will give paperwork to [facility] with [employer].  NCM gave paperwork to [facility]. She states the only thing she is waiting on is approval.  Secretary to make a follow up apt at the [facility] clinic for patient.  conts onf iv lasix, and amio drip.        Expected Discharge Plan and Services                                                 Social Determinants of Health (SDOH) Interventions    Readmission Risk Interventions No flowsheet data found.

## 2019-03-12 ENCOUNTER — Encounter (HOSPITAL_COMMUNITY)
Admission: EM | Disposition: A | Discharge status: Home / Self Care | Payer: Self-pay | Source: Home / Self Care | Attending: Internal Medicine

## 2019-03-12 ENCOUNTER — Encounter (HOSPITAL_COMMUNITY): Payer: Self-pay | Admitting: Cardiology

## 2019-03-12 DIAGNOSIS — I5043 Acute on chronic combined systolic (congestive) and diastolic (congestive) heart failure: Secondary | ICD-10-CM

## 2019-03-12 DIAGNOSIS — I509 Heart failure, unspecified: Secondary | ICD-10-CM

## 2019-03-12 DIAGNOSIS — I429 Cardiomyopathy, unspecified: Secondary | ICD-10-CM

## 2019-03-12 HISTORY — PX: RIGHT/LEFT HEART CATH AND CORONARY ANGIOGRAPHY: CATH118266

## 2019-03-12 LAB — POCT I-STAT EG7
Acid-Base Excess: 2 mmol/L (ref 0.0–2.0)
Acid-Base Excess: 2 mmol/L (ref 0.0–2.0)
Bicarbonate: 26.4 mmol/L (ref 20.0–28.0)
Bicarbonate: 27.1 mmol/L (ref 20.0–28.0)
Calcium, Ion: 1.15 mmol/L (ref 1.15–1.40)
Calcium, Ion: 1.19 mmol/L (ref 1.15–1.40)
HCT: 42 % (ref 39.0–52.0)
HCT: 42 % (ref 39.0–52.0)
Hemoglobin: 14.3 g/dL (ref 13.0–17.0)
Hemoglobin: 14.3 g/dL (ref 13.0–17.0)
O2 Saturation: 54 %
O2 Saturation: 57 %
Potassium: 3.7 mmol/L (ref 3.5–5.1)
Potassium: 3.9 mmol/L (ref 3.5–5.1)
Sodium: 139 mmol/L (ref 135–145)
Sodium: 139 mmol/L (ref 135–145)
TCO2: 28 mmol/L (ref 22–32)
TCO2: 28 mmol/L (ref 22–32)
pCO2, Ven: 41.2 mmHg — ABNORMAL LOW (ref 44.0–60.0)
pCO2, Ven: 41.7 mmHg — ABNORMAL LOW (ref 44.0–60.0)
pH, Ven: 7.415 (ref 7.250–7.430)
pH, Ven: 7.422 (ref 7.250–7.430)
pO2, Ven: 28 mmHg — CL (ref 32.0–45.0)
pO2, Ven: 29 mmHg — CL (ref 32.0–45.0)

## 2019-03-12 LAB — CBC
HCT: 41.4 % (ref 39.0–52.0)
Hemoglobin: 14 g/dL (ref 13.0–17.0)
MCH: 32 pg (ref 26.0–34.0)
MCHC: 33.8 g/dL (ref 30.0–36.0)
MCV: 94.5 fL (ref 80.0–100.0)
Platelets: 171 10*3/uL (ref 150–400)
RBC: 4.38 MIL/uL (ref 4.22–5.81)
RDW: 12.6 % (ref 11.5–15.5)
WBC: 7.2 10*3/uL (ref 4.0–10.5)
nRBC: 0 % (ref 0.0–0.2)

## 2019-03-12 LAB — BASIC METABOLIC PANEL
Anion gap: 12 (ref 5–15)
BUN: 16 mg/dL (ref 8–23)
CO2: 28 mmol/L (ref 22–32)
Calcium: 9.3 mg/dL (ref 8.9–10.3)
Chloride: 97 mmol/L — ABNORMAL LOW (ref 98–111)
Creatinine, Ser: 1.06 mg/dL (ref 0.61–1.24)
GFR calc Af Amer: >60 mL/min (ref >60)
GFR calc non Af Amer: >60 mL/min (ref >60)
Glucose, Bld: 117 mg/dL — ABNORMAL HIGH (ref 70–99)
Potassium: 4.1 mmol/L (ref 3.5–5.1)
Sodium: 137 mmol/L (ref 135–145)

## 2019-03-12 LAB — LIPID PANEL
Cholesterol: 134 mg/dL (ref 0–200)
HDL: 28 mg/dL — ABNORMAL LOW (ref >40)
LDL Cholesterol: 90 mg/dL (ref 0–99)
Total CHOL/HDL Ratio: 4.8 RATIO
Triglycerides: 81 mg/dL (ref <150)
VLDL: 16 mg/dL (ref 0–40)

## 2019-03-12 LAB — TSH: TSH: 1.541 u[IU]/mL (ref 0.350–4.500)

## 2019-03-12 SURGERY — RIGHT/LEFT HEART CATH AND CORONARY ANGIOGRAPHY
Anesthesia: LOCAL

## 2019-03-12 MED ORDER — AMIODARONE HCL 200 MG PO TABS: 200.0000 mg | ORAL_TABLET | Freq: Every day | ORAL | 0 refills | Status: DC

## 2019-03-12 MED ORDER — DIGOXIN 125 MCG PO TABS
0.1250 mg | ORAL_TABLET | Freq: Every day | ORAL | Status: DC
  Administered 2019-03-12: 0.125 mg via ORAL
  Filled 2019-03-12: qty 1

## 2019-03-12 MED ORDER — FUROSEMIDE 40 MG PO TABS
40.0000 mg | ORAL_TABLET | Freq: Every day | ORAL | Status: DC
  Administered 2019-03-12: 40 mg via ORAL
  Filled 2019-03-12: qty 1

## 2019-03-12 MED ORDER — SPIRONOLACTONE 25 MG PO TABS: 12.5000 mg | ORAL_TABLET | Freq: Every day | ORAL | 0 refills | Status: DC

## 2019-03-12 MED ORDER — SPIRONOLACTONE 12.5 MG HALF TABLET
12.5000 mg | ORAL_TABLET | Freq: Every day | ORAL | Status: DC
  Administered 2019-03-12: 12.5 mg via ORAL
  Filled 2019-03-12: qty 1

## 2019-03-12 MED ORDER — HEPARIN SODIUM (PORCINE) 1000 UNIT/ML IJ SOLN
INTRAMUSCULAR | Status: DC | PRN
  Administered 2019-03-12: 3000 [IU] via INTRAVENOUS

## 2019-03-12 MED ORDER — VERAPAMIL HCL 2.5 MG/ML IV SOLN
INTRAVENOUS | Status: DC | PRN
  Administered 2019-03-12: 09:00:00 10 mL via INTRA_ARTERIAL

## 2019-03-12 MED ORDER — FENTANYL CITRATE (PF) 100 MCG/2ML IJ SOLN
INTRAMUSCULAR | Status: AC
  Filled 2019-03-12: qty 2

## 2019-03-12 MED ORDER — LIDOCAINE HCL (PF) 1 % IJ SOLN
INTRAMUSCULAR | Status: DC | PRN
  Administered 2019-03-12: 3 mL

## 2019-03-12 MED ORDER — HEPARIN SODIUM (PORCINE) 1000 UNIT/ML IJ SOLN
INTRAMUSCULAR | Status: AC
  Filled 2019-03-12: qty 1

## 2019-03-12 MED ORDER — MIDAZOLAM HCL 2 MG/2ML IJ SOLN
INTRAMUSCULAR | Status: AC
  Filled 2019-03-12: qty 2

## 2019-03-12 MED ORDER — VERAPAMIL HCL 2.5 MG/ML IV SOLN
INTRAVENOUS | Status: AC
  Filled 2019-03-12: qty 2

## 2019-03-12 MED ORDER — AMIODARONE HCL 200 MG PO TABS
200.0000 mg | ORAL_TABLET | Freq: Two times a day (BID) | ORAL | Status: DC
  Administered 2019-03-12: 200 mg via ORAL
  Filled 2019-03-12: qty 1

## 2019-03-12 MED ORDER — HEPARIN (PORCINE) IN NACL 1000-0.9 UT/500ML-% IV SOLN
INTRAVENOUS | Status: DC | PRN
  Administered 2019-03-12 (×2): 500 mL

## 2019-03-12 MED ORDER — MIDAZOLAM HCL 2 MG/2ML IJ SOLN
INTRAMUSCULAR | Status: DC | PRN
  Administered 2019-03-12: 1 mg via INTRAVENOUS

## 2019-03-12 MED ORDER — HYDRALAZINE HCL 25 MG PO TABS: 12.5000 mg | ORAL_TABLET | Freq: Three times a day (TID) | ORAL | 1 refills | Status: DC

## 2019-03-12 MED ORDER — AMIODARONE HCL 200 MG PO TABS: 200.0000 mg | ORAL_TABLET | Freq: Two times a day (BID) | ORAL | 0 refills | Status: DC

## 2019-03-12 MED ORDER — HEPARIN (PORCINE) IN NACL 1000-0.9 UT/500ML-% IV SOLN
INTRAVENOUS | Status: AC
  Filled 2019-03-12: qty 1000

## 2019-03-12 MED ORDER — IOHEXOL 350 MG/ML SOLN
INTRAVENOUS | Status: DC | PRN
  Administered 2019-03-12: 09:00:00 50 mL via INTRA_ARTERIAL

## 2019-03-12 MED ORDER — ISOSORBIDE MONONITRATE ER 30 MG PO TB24: 15.0000 mg | ORAL_TABLET | Freq: Every day | ORAL | 1 refills | Status: DC

## 2019-03-12 MED ORDER — LIDOCAINE HCL (PF) 1 % IJ SOLN
INTRAMUSCULAR | Status: AC
  Filled 2019-03-12: qty 30

## 2019-03-12 MED ORDER — ADULT MULTIVITAMIN W/MINERALS CH
1.0000 | ORAL_TABLET | Freq: Every day | ORAL | Status: DC
  Administered 2019-03-12: 1 via ORAL
  Filled 2019-03-12: qty 1

## 2019-03-12 MED ORDER — ISOSORB DINITRATE-HYDRALAZINE 20-37.5 MG PO TABS: 0.5000 | ORAL_TABLET | Freq: Three times a day (TID) | ORAL | 0 refills | Status: DC

## 2019-03-12 MED ORDER — DIGOXIN 125 MCG PO TABS: 0.1250 mg | ORAL_TABLET | Freq: Every day | ORAL | 0 refills | Status: DC

## 2019-03-12 MED ORDER — FENTANYL CITRATE (PF) 100 MCG/2ML IJ SOLN
INTRAMUSCULAR | Status: DC | PRN
  Administered 2019-03-12: 25 ug via INTRAVENOUS

## 2019-03-12 MED ORDER — FUROSEMIDE 40 MG PO TABS: 40.0000 mg | ORAL_TABLET | Freq: Every day | ORAL | 0 refills | Status: DC

## 2019-03-12 MED FILL — AMIODARONE HCL 200 MG TAB: 200 | 30 days supply | Qty: 49 | Fill #0

## 2019-03-12 MED FILL — SPIRONOLACTONE 25 MG TABLET: 25 | 60 days supply | Qty: 30 | Fill #0

## 2019-03-12 MED FILL — FUROSEMIDE 40 MG TABLET: 40 | 30 days supply | Qty: 30 | Fill #0

## 2019-03-12 MED FILL — ISOSORBIDE MN ER 30 MG TAB: 30 | 60 days supply | Qty: 30 | Fill #0

## 2019-03-12 MED FILL — DIGOXIN 0.125 MG TABLET: 125 | 30 days supply | Qty: 30 | Fill #0

## 2019-03-12 MED FILL — hydrALAZINE HCL 25 MG TABS: 25 | 80 days supply | Qty: 120 | Fill #0

## 2019-03-12 SURGICAL SUPPLY — 11 items
CATH 5FR JL3.5 JR4 ANG PIG MP (CATHETERS) ×2 IMPLANT
CATH BALLN WEDGE 5F 110CM (CATHETERS) ×2 IMPLANT
DEVICE RAD COMP TR BAND LRG (VASCULAR PRODUCTS) ×2 IMPLANT
GLIDESHEATH SLEND SS 6F .021 (SHEATH) ×2 IMPLANT
GUIDEWIRE INQWIRE 1.5J.035X260 (WIRE) ×1 IMPLANT
INQWIRE 1.5J .035X260CM (WIRE) ×2
KIT HEART LEFT (KITS) ×2 IMPLANT
PACK CARDIAC CATHETERIZATION (CUSTOM PROCEDURE TRAY) ×2 IMPLANT
SHEATH GLIDE SLENDER 4/5FR (SHEATH) ×2 IMPLANT
TRANSDUCER W/STOPCOCK (MISCELLANEOUS) ×2 IMPLANT
WIRE EMERALD 3MM-J .025X260CM (WIRE) ×2 IMPLANT

## 2019-03-12 NOTE — Interval H&P Note (Signed)
History and Physical Interval Note:  03/12/2019 9:22 AM  Logan Lopez  has presented today for surgery, with the diagnosis of Heart Failure.  The various methods of treatment have been discussed with the patient and family. After consideration of risks, benefits and other options for treatment, the patient has consented to  Procedure(s): RIGHT/LEFT HEART CATH AND CORONARY ANGIOGRAPHY (N/A) as a surgical intervention.  The patient's history has been reviewed, patient examined, no change in status, stable for surgery.  I have reviewed the patient's chart and labs.  Questions were answered to the patient's satisfaction.     Darrion Wyszynski [employer]

## 2019-03-12 NOTE — Progress Notes (Signed)
Patient and his wife got discharge instructions and all questions answered to their satisfaction. Patient  is now waiting for his life vest.

## 2019-03-12 NOTE — Progress Notes (Signed)
Spoke with pharmacist about the compatibility of normal saline and amiodarone due to "caution variable" notification in micromedex.  Pharmacist stated it would be okay to "y" the two together.

## 2019-03-12 NOTE — Progress Notes (Signed)
   Ok to d/c home today.   Lasix 40 mg daily Digoxin 0.125 mg daily BIdil 1/2 tablet three times a day  Spironolactone 12.5 mg daily  Amiodarone 200 mg twice a day for 10 days then 200 mg daily.   HF follow up has been set up. Can go home once Life Vest applied. Spoke with [employer] Rep and plan to deliver today.   Ashlin Hidalgo NP-C  11:37 AM

## 2019-03-12 NOTE — Progress Notes (Signed)
Patient ID: Logan Lopez, male   DOB: Aug 21, 1955, 63 y.o.   MRN: [id number]     Advanced Heart Failure Rounding Note  PCP-Cardiologist: No primary care provider on file.   Subjective:    Patient feels better this morning, denies dyspnea at rest.  Less PVCs on amiodarone.   RHC/LHC (11/11):  Coronary Findings  Diagnostic Dominance: Right Left Main  No significant coronary disease.  Left Anterior Descending  No significant coronary disease.  Left Circumflex  No significant coronary disease.  Right Coronary Artery  No significant coronary disease.  Intervention  No interventions have been documented. Right Heart  Right Heart Pressures RHC Procedural Findings: Hemodynamics (mmHg) RA mean 3 RV 37/4 PA 41/21, mean 29 PCWP mean 17 LV 103/11 AO 107/67  Oxygen saturations: PA 56% AO 95%  Cardiac Output (Fick) 3.2  Cardiac Index (Fick) 1.8 PVR 3.75 WU      Objective:   Weight Range: 60.1 kg Body mass index is 17.97 kg/m.   Vital Signs:   Temp:  [97.5 F (36.4 C)-98.4 F (36.9 C)] 97.8 F (36.6 C) (11/11 0429) Pulse Rate:  [74-89] 85 (11/11 0429) Resp:  [16-18] 18 (11/11 0429) BP: (95-100)/(59-85) 100/74 (11/11 0429) SpO2:  [95 %-99 %] 98 % (11/11 0843) Weight:  [60.1 kg] 60.1 kg (11/11 0429) Last BM Date: 03/11/19  Weight change: Filed Weights   03/10/19 1319 03/11/19 0118 03/12/19 0429  Weight: 60.6 kg 59.9 kg 60.1 kg    Intake/Output:   Intake/Output Summary (Last 24 hours) at 03/12/2019 0931 Last data filed at 03/12/2019 0554 Gross per 24 hour  Intake 980.2 ml  Output 1150 ml  Net -169.8 ml      Physical Exam    General:  Well appearing. No resp difficulty HEENT: Normal Neck: Supple. JVP not elevated. Carotids 2+ bilat; no bruits. No lymphadenopathy or thyromegaly appreciated. Cor: PMI nondisplaced. Regular rate & rhythm. No rubs, gallops or murmurs. Lungs: Clear Abdomen: Soft, nontender, nondistended. No hepatosplenomegaly. No  bruits or masses. Good bowel sounds. Extremities: No cyanosis, clubbing, rash, edema Neuro: Alert & orientedx3, cranial nerves grossly intact. moves all 4 extremities w/o difficulty. Affect pleasant   Telemetry   NSR with PVCs, less than yesterday.  Personally reviewed.   Labs    CBC Recent Labs    03/10/19 0513 03/12/19 0340  WBC 7.7 7.2  NEUTROABS 4.3  --   HGB 14.3 14.0  HCT 44.6 41.4  MCV 97.6 94.5  PLT 201 171   Basic Metabolic Panel Recent Labs    35/00/93 0424 03/12/19 0340  NA 140 137  K 4.2 4.1  CL 102 97*  CO2 27 28  GLUCOSE 105* 117*  BUN 10 16  CREATININE 1.03 1.06  CALCIUM 9.3 9.3  MG 2.1  --    Liver Function Tests Recent Labs    03/10/19 0513  AST 50*  ALT 38  ALKPHOS 156*  BILITOT 1.2  PROT 7.4  ALBUMIN 3.8   Recent Labs    03/10/19 0513  LIPASE 26   Cardiac Enzymes No results for input(s): CKTOTAL, CKMB, CKMBINDEX, TROPONINI in the last 72 hours.  BNP: BNP (last 3 results) Recent Labs    03/10/19 0513  BNP 1,080.2*    ProBNP (last 3 results) No results for input(s): PROBNP in the last 8760 hours.   D-Dimer Recent Labs    03/10/19 0513  DDIMER 2.02*   Hemoglobin A1C Recent Labs    03/11/19 1453  HGBA1C 6.1*  Fasting Lipid Panel Recent Labs    03/12/19 0340  CHOL 134  HDL 28*  LDLCALC 90  TRIG 81  CHOLHDL 4.8   Thyroid Function Tests Recent Labs    03/12/19 0340  TSH 1.541    Other results:   Imaging     No results found.   Medications:     Scheduled Medications: . amiodarone  200 mg Oral BID  . digoxin  0.125 mg Oral Daily  . enoxaparin (LOVENOX) injection  40 mg Subcutaneous Q24H  . feeding supplement (ENSURE ENLIVE)  237 mL Oral BID BM  . furosemide  40 mg Oral Daily  . isosorbide-hydrALAZINE  0.5 tablet Oral TID  . multivitamin with minerals  1 tablet Oral Daily  . pneumococcal 23 valent vaccine  0.5 mL Intramuscular Tomorrow-1000  . sodium chloride flush  3 mL Intravenous Q12H   . spironolactone  12.5 mg Oral Daily     Infusions:   PRN Medications:  ondansetron **OR** ondansetron (ZOFRAN) IV   Assessment/Plan   1. Acute systolic CHF: Newly noted cardiomyopathy with severe LV dilation, EF 25-30%, RV ok.  He was admitted with 1 week of exertional dyspnea/orthopnea, has diuresed well here and feels better.  No family history of cardiomyopathy.  No significant coronary disease on cath today.  No definite pre-existing viral symptoms.  It is possible that this could be a PVC-mediated CMP (or that the cardiomyopathy itself could be driving the PVCs).  RHC shows fairly well optimized filling pressures, cardiac index low at 1.8.  He is no longer volume overloaded on exam and feels better.  - Start Lasix 40 mg daily.  - Add digoxin 0.125 daily with low output. Would treat aggressively with oral medications, do not think he needs milrinone.   - Continue Bidil 1/2 tab tid.  - Suppress PVCs/NSVT => amiodarone, see below.  - Add spironolactone 12.5 daily today.  [facility] as next step, likely as outpatient.   2. PVCs/NSVT: Very frequent.  ?Cause of cardiomyopathy versus result of cardiomyopathy.  Decreased PVCs now on amiodarone.  - Transition amiodarone to 200 mg po bid x 10 days then 200 mg daily.  - Will need Lifevest at discharge.  - Keep up K and Mg.  3. Smoking: I strongly recommended that he quit.   He is fairly well optimized at this point, will need aggressive med titration as outpatient.  Should be able to go home soon.   Length of Stay: 2  Loralie Champagne, MD  03/12/2019, 9:31 AM  Advanced Heart Failure Team Pager [phone] (M-F; 7a - 4p)  Please contact [facility] for night-coverage after hours (4p -7a ) and weekends on [email or web address]

## 2019-03-12 NOTE — Discharge Summary (Signed)
Physician Discharge Summary  KAMIL NOLAZCO FKC:[id number] DOB: [birth year] DOA: 03/10/2019  PCP: Patient, No Pcp Per  Admit date: 03/10/2019 Discharge date: 03/12/2019  Admitted From: Home Disposition:  Home  Discharge Condition:Stable CODE STATUS:FULL Diet recommendation: Heart Healthy   Brief/Interim Summary:  Patient is a 63 year old male with no significant past medical history, smoker, who has not seen a physician for a while who presented with 4 to 5-day history of abdominal pain/epigastric pain, nausea, vomiting, orthopnea.  He also reported bilateral lower extremity edema.  Work-up in the emergency department showed pulmonary edema with small bilateral pleural effusion compatible with congestive heart failure, elevated BNP.  He was admitted for the management of possible acute congestive heart failure.  Echocardiogram done here showed ejection fraction around 25-30%.  Cardiology consulted.  Underwent  right/left heart cath with finding of no significant coronary artery disease suggesting nonischemic cardiomyopathy.  His cardiomyopathy could be associated with chronic PVCs.  He qualified for LifeVest.  Optimized medications.  Cardiology cleared him for discharge.  He is hemodynamically stable for discharge home today.  Following problems were addressed during his hospitalization:  Acute systolic CHF: Echocardiogram showed ejection fraction of 20 to 30%, grade 2 diastolic dysfunction.  Presented with volume overload with bilateral lower extremity edema. Elevated BNP. Pulmonary edema on chest x-ray.  Report of orthopnea.  Cardiology consulted and was following.  Now he appears euvolemic.Underwent  right /left heart catheterization with above findings. Lower extremity edema has significantly improved.  His lungs are clear on auscultation. Cardiac medications optimized.  He qualified for LifeVest.  He will be followed up cardiology as an outpatient  Abdominal pain, nausea and vomiting: .   Abdomen pain,nausea,vomiting  have resolved.  PVCs: Noted to have 20-30 PVCs per hour.  Started on amnio drip. Changed to PO amiodarone.  Tobacco abuse: Smokes almost 1 pack a day.  Counseled cessation  Discharge Diagnoses:  Active Problems:   CHF exacerbation (HCC)   Congestive heart failure (CHF) [facility])    Discharge Instructions  Discharge Instructions    Diet - low sodium heart healthy   Complete by: As directed    Discharge instructions   Complete by: As directed    1)Follow up at [facility] for appointment. 2)Cardiology will call you for follow up appointment. 3)Take prescribed medications as instructed.   Increase activity slowly   Complete by: As directed      Allergies as of 03/12/2019      Reactions   Penicillins    Did it involve swelling of the face/tongue/throat, SOB, or low BP? No Did it involve sudden or severe rash/hives, skin peeling, or any reaction on the inside of your mouth or nose? yes Did you need to seek medical attention at a hospital or doctor's office? No When did it last happen? If all above answers are "NO", may proceed with cephalosporin use.      Medication List    TAKE these medications   acetaminophen 500 MG tablet Commonly known as: TYLENOL Take 1,000 mg by mouth every 6 (six) hours as needed for mild pain.   Alka-Seltzer Plus Cold 2-7.8-325 MG Tbef Generic drug: Chlorphen-Phenyleph-ASA Take 2 tablets by mouth every 6 (six) hours as needed (cold).   amiodarone 200 MG tablet Commonly known as: PACERONE Take 1 tablet (200 mg total) by mouth 2 (two) times daily for 10 days.   amiodarone 200 MG tablet Commonly known as: Pacerone Take 1 tablet (200 mg total) by mouth daily. Start  taking on: March 22, 2019   digoxin 0.125 MG tablet Commonly known as: LANOXIN Take 1 tablet (0.125 mg total) by mouth daily. Start taking on: March 13, 2019   ferrous sulfate 325 (65 FE) MG tablet Take 325 mg by mouth  daily with breakfast.   furosemide 40 MG tablet Commonly known as: LASIX Take 1 tablet (40 mg total) by mouth daily. Start taking on: March 13, 2019   ibuprofen 200 MG tablet Commonly known as: ADVIL Take 400 mg by mouth every 6 (six) hours as needed for moderate pain.   isosorbide-hydrALAZINE 20-37.5 MG tablet Commonly known as: BIDIL Take 0.5 tablets by mouth 3 (three) times daily.   pantoprazole 40 MG tablet Commonly known as: PROTONIX Take 40 mg by mouth daily.   spironolactone 25 MG tablet Commonly known as: ALDACTONE Take 0.5 tablets (12.5 mg total) by mouth daily. Start taking on: March 13, 2019   Theraflu ExpressMax 10-5-325 MG/15ML Liqd Generic drug: DM-Phenylephrine-Acetaminophen Take 15 mLs by mouth every 8 (eight) hours as needed (cold symptoms).   vitamin C 500 MG tablet Commonly known as: ASCORBIC ACID Take 500 mg by mouth daily.            Durable Medical Equipment  (From admission, onward)         Start     Ordered   03/11/19 1231  For home use only DME Vest life vest  Once    Comments: ORDERed FAXed to [employer] today and Rep was called   03/11/19 1231         Follow-up Information    [facility] Follow up on 03/20/2019.   Specialty: Cardiology Why: at 1200  [id number]  Contact information: [address] [id number] [address] [id number] [phone]       [facility]. Go on 04/15/2019.   Why: @3 :30pm Contact information: [address] [place] [phone]         Allergies  Allergen Reactions  . Penicillins     Did it involve swelling of the face/tongue/throat, SOB, or low BP? No Did it involve sudden or severe rash/hives, skin peeling, or any reaction on the inside of your mouth or nose? yes Did you need to seek medical attention at a hospital or doctor's office? No When did it last  happen? If all above answers are "NO", may proceed with cephalosporin use.    Consultations:  cardiology   Procedures/Studies: Ct Angio Chest Pe W And/or Wo Contrast  Result Date: 03/10/2019 CLINICAL DATA:  Worsening upper abdominal pain over the past few weeks. Shortness of breath for 2 days. EXAM: CT ANGIOGRAPHY CHEST CT ABDOMEN AND PELVIS WITH CONTRAST TECHNIQUE: Multidetector CT imaging of the chest was performed using the standard protocol during bolus administration of intravenous contrast. Multiplanar CT image reconstructions and MIPs were obtained to evaluate the vascular anatomy. Multidetector CT imaging of the abdomen and pelvis was performed using the standard protocol during bolus administration of intravenous contrast. CONTRAST:  100 mL OMNIPAQUE IOHEXOL 350 MG/ML SOLN COMPARISON:  Single-view of the chest today. FINDINGS: CTA CHEST FINDINGS Cardiovascular: Satisfactory opacification of the pulmonary arteries to the segmental level. No evidence of pulmonary embolism. Marked cardiomegaly. No pericardial effusion. Mediastinum/Nodes: No enlarged mediastinal, hilar, or axillary lymph nodes. Thyroid gland, trachea, and esophagus demonstrate no significant findings. Lungs/Pleura: Moderate bilateral pleural effusions. Ground-glass attenuation is seen throughout. No nodule, mass or consolidative process.  Emphysematous change is seen in the apices. Musculoskeletal: No acute or focal abnormality. Review of the MIP images confirms the above findings. CT ABDOMEN and PELVIS FINDINGS Hepatobiliary: No focal liver abnormality is seen. No gallstones, gallbladder wall thickening, or biliary dilatation. Pancreas: Unremarkable. No pancreatic ductal dilatation or surrounding inflammatory changes. Spleen: Normal in size without focal abnormality. Adrenals/Urinary Tract: Adrenal glands are unremarkable. Kidneys are normal, without renal calculi, focal lesion, or hydronephrosis. Bladder is unremarkable.  Stomach/Bowel: Stomach is within normal limits. Appendix appears normal. No evidence of bowel wall thickening, distention, or inflammatory changes. Vascular/Lymphatic: No significant vascular findings are present. No enlarged abdominal or pelvic lymph nodes. Reproductive: Prostate is unremarkable. Other: There is a small volume of free pelvic fluid. Musculoskeletal: No acute or focal bony abnormality. Review of the MIP images confirms the above findings. IMPRESSION: Ground-glass attenuation throughout both lungs is likely due to pulmonary edema in this patient with marked cardiomegaly and moderate bilateral pleural effusions. Negative for pulmonary embolus. Small volume of free pelvic fluid is nonspecific and could be due to volume overload or possibly enteritis. Bowel loops appear normal. Electronically Signed   By: Inge Rise M.D.   On: 03/10/2019 07:50   Ct Abdomen Pelvis W Contrast  Result Date: 03/10/2019 CLINICAL DATA:  Worsening upper abdominal pain over the past few weeks. Shortness of breath for 2 days. EXAM: CT ANGIOGRAPHY CHEST CT ABDOMEN AND PELVIS WITH CONTRAST TECHNIQUE: Multidetector CT imaging of the chest was performed using the standard protocol during bolus administration of intravenous contrast. Multiplanar CT image reconstructions and MIPs were obtained to evaluate the vascular anatomy. Multidetector CT imaging of the abdomen and pelvis was performed using the standard protocol during bolus administration of intravenous contrast. CONTRAST:  100 mL OMNIPAQUE IOHEXOL 350 MG/ML SOLN COMPARISON:  Single-view of the chest today. FINDINGS: CTA CHEST FINDINGS Cardiovascular: Satisfactory opacification of the pulmonary arteries to the segmental level. No evidence of pulmonary embolism. Marked cardiomegaly. No pericardial effusion. Mediastinum/Nodes: No enlarged mediastinal, hilar, or axillary lymph nodes. Thyroid gland, trachea, and esophagus demonstrate no significant findings. Lungs/Pleura:  Moderate bilateral pleural effusions. Ground-glass attenuation is seen throughout. No nodule, mass or consolidative process. Emphysematous change is seen in the apices. Musculoskeletal: No acute or focal abnormality. Review of the MIP images confirms the above findings. CT ABDOMEN and PELVIS FINDINGS Hepatobiliary: No focal liver abnormality is seen. No gallstones, gallbladder wall thickening, or biliary dilatation. Pancreas: Unremarkable. No pancreatic ductal dilatation or surrounding inflammatory changes. Spleen: Normal in size without focal abnormality. Adrenals/Urinary Tract: Adrenal glands are unremarkable. Kidneys are normal, without renal calculi, focal lesion, or hydronephrosis. Bladder is unremarkable. Stomach/Bowel: Stomach is within normal limits. Appendix appears normal. No evidence of bowel wall thickening, distention, or inflammatory changes. Vascular/Lymphatic: No significant vascular findings are present. No enlarged abdominal or pelvic lymph nodes. Reproductive: Prostate is unremarkable. Other: There is a small volume of free pelvic fluid. Musculoskeletal: No acute or focal bony abnormality. Review of the MIP images confirms the above findings. IMPRESSION: Ground-glass attenuation throughout both lungs is likely due to pulmonary edema in this patient with marked cardiomegaly and moderate bilateral pleural effusions. Negative for pulmonary embolus. Small volume of free pelvic fluid is nonspecific and could be due to volume overload or possibly enteritis. Bowel loops appear normal. Electronically Signed   By: Inge Rise M.D.   On: 03/10/2019 07:50   Dg Chest Portable 1 View  Result Date: 03/10/2019 CLINICAL DATA:  Progressive upper abdominal pain over the last week. Shortness of  breath. EXAM: PORTABLE CHEST 1 VIEW COMPARISON:  None. FINDINGS: Heart is enlarged. Mild edema is present. Small effusions are present bilaterally. Bibasilar airspace opacification is present. There is no other  significant consolidation. The visualized soft tissues and bony thorax are unremarkable. IMPRESSION: 1. Cardiomegaly with mild edema and small bilateral pleural effusions compatible with congestive heart failure. 2. Bibasilar airspace disease likely reflects atelectasis. Infection is considered less likely. Electronically Signed   By: Marin Roberts M.D.   On: 03/10/2019 05:55       Subjective: Patient seen and examined the bedside this morning.  Hemodynamically stable.  Comfortable.  Stable for discharge home today  Discharge Exam: Vitals:   03/12/19 1011 03/12/19 1100  BP:  110/60  Pulse: 78   Resp:    Temp:    SpO2:     Vitals:   03/12/19 0942 03/12/19 1008 03/12/19 1011 03/12/19 1100  BP: (!) 96/41 113/74  110/60  Pulse:  78 78   Resp:      Temp:      TempSrc:      SpO2:      Weight:      Height:        General: Pt is alert, awake, not in acute distress Cardiovascular: RRR, S1/S2 +, no rubs, no gallops Respiratory: CTA bilaterally, no wheezing, no rhonchi Abdominal: Soft, NT, ND, bowel sounds + Extremities: no edema, no cyanosis    The results of significant diagnostics from this hospitalization (including imaging, microbiology, ancillary and laboratory) are listed below for reference.     Microbiology: Recent Results (from the past 240 hour(s))  SARS CORONAVIRUS 2 (TAT 6-24 HRS) Nasopharyngeal Nasopharyngeal Swab     Status: None   Collection Time: 03/10/19  5:34 AM   Specimen: Nasopharyngeal Swab  Result Value Ref Range Status   SARS Coronavirus 2 NEGATIVE NEGATIVE Final    Comment: (NOTE) SARS-CoV-2 target nucleic acids are NOT DETECTED. The SARS-CoV-2 RNA is generally detectable in upper and lower respiratory specimens during the acute phase of infection. Negative results do not preclude SARS-CoV-2 infection, do not rule out co-infections with other pathogens, and should not be used as the sole basis for treatment or other patient management  decisions. Negative results must be combined with clinical observations, patient history, and epidemiological information. The expected result is Negative. Fact Sheet for Patients: HairSlick.no Fact Sheet for Healthcare Providers: quierodirigir.com This test is not yet approved or cleared by the [place] FDA and  has been authorized for detection and/or diagnosis of SARS-CoV-2 by FDA under an Emergency Use Authorization (EUA). This EUA will remain  in effect (meaning this test can be used) for the duration of the COVID-19 declaration under Section 56 4(b)(1) of the Act, 21 U.S.C. section 360bbb-3(b)(1), unless the authorization is terminated or revoked sooner. Performed at [facility], [address] [place], [place] [address]      Labs: BNP (last 3 results) Recent Labs    03/10/19 0513  BNP 1,080.2*   Basic Metabolic Panel: Recent Labs  Lab 03/10/19 0513 03/11/19 0424 03/12/19 0340 03/12/19 0901  NA 137 140 137 139  139  K 4.4 4.2 4.1 3.7  3.9  CL 104 102 97*  --   CO2 23 27 28   --   GLUCOSE 135* 105* 117*  --   BUN 11 10 16   --   CREATININE 0.89 1.03 1.06  --   CALCIUM 9.2 9.3 9.3  --   MG  --  2.1  --   --    Liver Function Tests: Recent Labs  Lab 03/10/19 0513  AST 50*  ALT 38  ALKPHOS 156*  BILITOT 1.2  PROT 7.4  ALBUMIN 3.8   Recent Labs  Lab 03/10/19 0513  LIPASE 26   No results for input(s): AMMONIA in the last 168 hours. CBC: Recent Labs  Lab 03/10/19 0513 03/12/19 0340 03/12/19 0901  WBC 7.7 7.2  --   NEUTROABS 4.3  --   --   HGB 14.3 14.0 14.3  14.3  HCT 44.6 41.4 42.0  42.0  MCV 97.6 94.5  --   PLT 201 171  --    Cardiac Enzymes: No results for input(s): CKTOTAL, CKMB, CKMBINDEX, TROPONINI in the last 168 hours. BNP: Invalid input(s): POCBNP CBG: No results for input(s): GLUCAP in the last 168 hours. D-Dimer Recent Labs    03/10/19 0513  DDIMER 2.02*    Hgb A1c Recent Labs    03/11/19 1453  HGBA1C 6.1*   Lipid Profile Recent Labs    03/12/19 0340  CHOL 134  HDL 28*  LDLCALC 90  TRIG 81  CHOLHDL 4.8   Thyroid function studies Recent Labs    03/12/19 0340  TSH 1.541   Anemia work up No results for input(s): VITAMINB12, FOLATE, FERRITIN, TIBC, IRON, RETICCTPCT in the last 72 hours. Urinalysis No results found for: COLORURINE, APPEARANCEUR, LABSPEC, PHURINE, GLUCOSEU, HGBUR, BILIRUBINUR, KETONESUR, PROTEINUR, UROBILINOGEN, NITRITE, LEUKOCYTESUR Sepsis Labs Invalid input(s): PROCALCITONIN,  WBC,  LACTICIDVEN Microbiology Recent Results (from the past 240 hour(s))  SARS CORONAVIRUS 2 (TAT 6-24 HRS) Nasopharyngeal Nasopharyngeal Swab     Status: None   Collection Time: 03/10/19  5:34 AM   Specimen: Nasopharyngeal Swab  Result Value Ref Range Status   SARS Coronavirus 2 NEGATIVE NEGATIVE Final    Comment: (NOTE) SARS-CoV-2 target nucleic acids are NOT DETECTED. The SARS-CoV-2 RNA is generally detectable in upper and lower respiratory specimens during the acute phase of infection. Negative results do not preclude SARS-CoV-2 infection, do not rule out co-infections with other pathogens, and should not be used as the sole basis for treatment or other patient management decisions. Negative results must be combined with clinical observations, patient history, and epidemiological information. The expected result is Negative. Fact Sheet for Patients: HairSlick.nohttps://www.fda.gov/media/138098/download Fact Sheet for Healthcare Providers: quierodirigir.comhttps://www.fda.gov/media/138095/download This test is not yet approved or cleared by the [place] FDA and  has been authorized for detection and/or diagnosis of SARS-CoV-2 by FDA under an Emergency Use Authorization (EUA). This EUA will remain  in effect (meaning this test can be used) for the duration of the COVID-19 declaration under Section 56 4(b)(1) of the Act, 21 U.S.C. section  360bbb-3(b)(1), unless the authorization is terminated or revoked sooner. Performed at [facility], [address] [place], [place] [id number]     Please note: You were cared for by a hospitalist during your hospital stay. Once you are discharged, your primary care physician will handle any further medical issues. Please note that NO REFILLS for any discharge medications will be authorized once you are discharged, as it is imperative that you return to your primary care physician (or establish a relationship with a primary care physician if you do not have one) for your post hospital discharge needs so that they can reassess your need for medications and monitor your lab values.    Time coordinating discharge: 40 minutes  SIGNED:   Burnadette PopAmrit Jeovanni Heuring, MD  [facility] 03/12/2019, 12:05 PM  Pager [phone]  If 7PM-7AM, please contact night-coverage [email or web address] Password [email or web address]

## 2019-03-12 NOTE — Progress Notes (Signed)
[id number] Gave pt walking instructions and discussed CRP 2. Will refer to [place] CRP 2. Put on lifevest video for pt and wife to view. Pt stated he has quit smoking. Graylon Good RN BSN 03/12/2019 2:11 PM

## 2019-03-12 NOTE — Progress Notes (Signed)
IV team order placed for 20G RAC for heart cath.  Site previously placed by IV team too high per cath nurse.  This RN unsuccessful previously so additional consult placed for RAC IV.

## 2019-03-12 NOTE — Progress Notes (Signed)
Pharmacist Heart Failure Core Measure Documentation  Assessment: Logan Lopez has an EF documented as 25-30% on echo 03/10/19.   Rationale: Heart failure patients with left ventricular systolic dysfunction (LVSD) and an EF < 40% should be prescribed an angiotensin converting enzyme inhibitor (ACEI) or angiotensin receptor blocker (ARB) at discharge unless a contraindication is documented in the medical record.  This patient is not currently on an ACEI or ARB for HF.  This note is being placed in the record in order to provide documentation that a contraindication to the use of these agents is present for this encounter.  ACE Inhibitor or Angiotensin Receptor Blocker is contraindicated (specify all that apply)  []   ACEI allergy AND ARB allergy []   Angioedema []   Moderate or severe aortic stenosis []   Hyperkalemia [x]   Hypotension []   Renal artery stenosis []   Worsening renal function, preexisting renal disease or dysfunction  Planning entresto as outpatient.   Erin Hearing PharmD., BCPS Clinical Pharmacist 03/12/2019 2:48 PM

## 2019-03-20 ENCOUNTER — Encounter (HOSPITAL_COMMUNITY): Payer: Self-pay

## 2019-03-20 ENCOUNTER — Other Ambulatory Visit: Payer: Self-pay

## 2019-03-20 ENCOUNTER — Ambulatory Visit (HOSPITAL_COMMUNITY)
Admit: 2019-03-20 | Discharge: 2019-03-20 | Disposition: A | Discharge status: Home / Self Care | Payer: 59 | Source: Ambulatory Visit | Attending: Adult Health | Admitting: Adult Health

## 2019-03-20 ENCOUNTER — Telehealth (HOSPITAL_COMMUNITY): Payer: Self-pay

## 2019-03-20 VITALS — BP 134/72 | HR 68 | Wt 139.6 lb

## 2019-03-20 DIAGNOSIS — Z79899 Other long term (current) drug therapy: Secondary | ICD-10-CM | POA: Insufficient documentation

## 2019-03-20 DIAGNOSIS — Z87891 Personal history of nicotine dependence: Secondary | ICD-10-CM

## 2019-03-20 DIAGNOSIS — D509 Iron deficiency anemia, unspecified: Secondary | ICD-10-CM | POA: Insufficient documentation

## 2019-03-20 DIAGNOSIS — I5041 Acute combined systolic (congestive) and diastolic (congestive) heart failure: Secondary | ICD-10-CM | POA: Diagnosis not present

## 2019-03-20 DIAGNOSIS — I493 Ventricular premature depolarization: Secondary | ICD-10-CM | POA: Diagnosis not present

## 2019-03-20 DIAGNOSIS — I5022 Chronic systolic (congestive) heart failure: Secondary | ICD-10-CM | POA: Diagnosis not present

## 2019-03-20 DIAGNOSIS — I4510 Unspecified right bundle-branch block: Secondary | ICD-10-CM | POA: Diagnosis not present

## 2019-03-20 DIAGNOSIS — Z7982 Long term (current) use of aspirin: Secondary | ICD-10-CM | POA: Insufficient documentation

## 2019-03-20 LAB — BASIC METABOLIC PANEL
Anion gap: 12 (ref 5–15)
BUN: 19 mg/dL (ref 8–23)
CO2: 26 mmol/L (ref 22–32)
Calcium: 9.7 mg/dL (ref 8.9–10.3)
Chloride: 97 mmol/L — ABNORMAL LOW (ref 98–111)
Creatinine, Ser: 0.98 mg/dL (ref 0.61–1.24)
GFR calc Af Amer: >60 mL/min (ref >60)
GFR calc non Af Amer: >60 mL/min (ref >60)
Glucose, Bld: 109 mg/dL — ABNORMAL HIGH (ref 70–99)
Potassium: 4.6 mmol/L (ref 3.5–5.1)
Sodium: 135 mmol/L (ref 135–145)

## 2019-03-20 LAB — MAGNESIUM: Magnesium: 2.1 mg/dL (ref 1.7–2.4)

## 2019-03-20 MED ORDER — SPIRONOLACTONE 25 MG PO TABS: 25.0000 mg | ORAL_TABLET | Freq: Every day | ORAL | 3 refills | Status: DC

## 2019-03-20 MED ORDER — FUROSEMIDE 40 MG PO TABS: 40.0000 mg | ORAL_TABLET | ORAL | 0 refills | Status: DC | PRN

## 2019-03-20 NOTE — Telephone Encounter (Signed)
fmla paperwork ready for pick up, paperwork given to patient in office 03/20/2019

## 2019-03-20 NOTE — Progress Notes (Signed)
PCP: Primary Cardiologist: Dr Shirlee Latch   HPI: Logan Lopez is a 63 year old with a history of iron deficient anemia, smoker ( 1 pack every 2 days ), newly diagnosed acute systolic heart failure, and newly identified PVCs.   Admitted increased dyspnea. ECHO showed reducued EF 25-30%. LHC/RHC completed with  completed  Low cardiac ouput/index, and no significant coronary diesase. While hospitalized he had high PVC burden so amio was started. Discharged with Life Vest,    Today he returns for post hospital follow up.Overall feeling fine. Says he was dizziness in the morning after he takes his medications. Not much energy. Denies SOB/PND/Orthopnea. Appetite ok. No fever or chills. Weight at home trending down from 134-->132  pounds. Taking all medications. Currently not working. Wearing Life Vest with no issues. He is not smoking or drinking alcohol.    SH: Drinks wine coolers on the weekend only 5-6. 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 Lives with his wife.   PMH 1. IDA 2. Tobacco Abuse 3. Systolic HF, NICM 03/10/19 ECHO EF 25-30%  4. PVCs   RHC 03/12/19  RA mean 3 RV 37/4 PA 41/21, mean 29 PCWP mean 17 LV 103/11 AO 107/67 Oxygen saturations: PA 56% AO 95% Cardiac Output (Fick) 3.2  Cardiac Index (Fick) 1.8 PVR 3.75 WU ROS: All systems negative except as listed in HPI, PMH and Problem List.  SH:  Social History   Socioeconomic History  . Marital status: Married    Spouse name: Not on file  . Number of children: Not on file  . Years of education: Not on file  . Highest education level: Not on file  Occupational History  . Not on file  Social Needs  . Financial resource strain: Not on file  . Food insecurity    Worry: Not on file    Inability: Not on file  . Transportation needs    Medical: Not on file    Non-medical: Not on file  Tobacco Use  . Smoking status: Current Every Day Smoker    Years: 9.00    Types: Cigarettes  . Smokeless tobacco:  Never Used  Substance and Sexual Activity  . Alcohol use: Yes    Comment: one day a week   . Drug use: Never  . Sexual activity: Not on file  Lifestyle  . Physical activity    Days per week: Not on file    Minutes per session: Not on file  . Stress: Not on file  Relationships  . Social Musician on phone: Not on file    Gets together: Not on file    Attends religious service: Not on file    Active member of club or organization: Not on file    Attends meetings of clubs or organizations: Not on file    Relationship status: Not on file  . Intimate partner violence    Fear of current or ex partner: Not on file    Emotionally abused: Not on file    Physically abused: Not on file    Forced sexual activity: Not on file  Other Topics Concern  . Not on file  Social History Narrative  . Not on file    FH: No family history on file.  Past Medical History:  Diagnosis Date  . CHF (congestive heart failure) (HCC)    new 03/2019  . Dyspnea     Current Outpatient Medications  Medication Sig Dispense Refill  .  acetaminophen (TYLENOL) 500 MG tablet Take 1,000 mg by mouth every 6 (six) hours as needed for mild pain.    Marland Kitchen amiodarone (PACERONE) 200 MG tablet Take 1 tablet (200 mg total) by mouth 2 (two) times daily for 10 days. 19 tablet 0  . [START ON 03/22/2019] amiodarone (PACERONE) 200 MG tablet Take 1 tablet (200 mg total) by mouth daily. 30 tablet 0  . Chlorphen-Phenyleph-ASA (ALKA-SELTZER PLUS COLD) 2-7.8-325 MG TBEF Take 2 tablets by mouth every 6 (six) hours as needed (cold).    Marland Kitchen digoxin (LANOXIN) 0.125 MG tablet Take 1 tablet (0.125 mg total) by mouth daily. 30 tablet 0  . DM-Phenylephrine-Acetaminophen (THERAFLU EXPRESSMAX) 10-5-325 MG/15ML LIQD Take 15 mLs by mouth every 8 (eight) hours as needed (cold symptoms).    . ferrous sulfate 325 (65 FE) MG tablet Take 325 mg by mouth daily with breakfast.    . furosemide (LASIX) 40 MG tablet Take 1 tablet (40 mg total) by  mouth daily. 30 tablet 0  . hydrALAZINE (APRESOLINE) 25 MG tablet Take 0.5 tablets (12.5 mg total) by mouth 3 (three) times daily. 120 tablet 1  . ibuprofen (ADVIL) 200 MG tablet Take 400 mg by mouth every 6 (six) hours as needed for moderate pain.    . isosorbide mononitrate (IMDUR) 30 MG 24 hr tablet Take 0.5 tablets (15 mg total) by mouth daily. 30 tablet 1  . pantoprazole (PROTONIX) 40 MG tablet Take 40 mg by mouth daily.    Marland Kitchen spironolactone (ALDACTONE) 25 MG tablet Take 0.5 tablets (12.5 mg total) by mouth daily. 30 tablet 0  . vitamin C (ASCORBIC ACID) 500 MG tablet Take 500 mg by mouth daily.     No current facility-administered medications for this encounter.     Vitals:   03/20/19 1154  BP: 134/72  Pulse: 68  SpO2: 98%  Weight: 63.3 kg (139 lb 9.6 oz)   Wt Readings from Last 3 Encounters:  03/20/19 63.3 kg (139 lb 9.6 oz)  03/12/19 60.1 kg (132 lb 8 oz)    PHYSICAL EXAM: General:  Well appearing. No resp difficulty HEENT: normal Neck: supple. JVP flat. Carotids 2+ bilaterally; no bruits. No lymphadenopathy or thryomegaly appreciated. Cor: PMI normal. Regular rate & rhythm. No rubs, gallops or murmurs. Life Vest on Lungs: clear Abdomen: soft, nontender, nondistended. No hepatosplenomegaly. No bruits or masses. Good bowel sounds. Extremities: no cyanosis, clubbing, rash, edema Neuro: alert & orientedx3, cranial nerves grossly intact. Moves all 4 extremities w/o difficulty. Affect pleasant.   ECG: NSR 71 bpm   ASSESSMENT & PLAN: 1. Chronic Systolic c CHF: Newly noted cardiomyopathy with severe LV dilation, 03/10/19 EF 25-30%, RV ok.   No family history of cardiomyopathy. No significant coronary disease on cath today. No definite pre-existing viral symptoms. It is possible that this could be a PVC-mediated CMP (or that the cardiomyopathy itself could be driving the PVCs). RHC showed fairly well optimized filling pressures, cardiac index low at 1.8.  LHC - no  significant coronary diseased.  - NYHA II-III. Volume status low. Stop daily lasix and change to lasix as needed for weight 137 or greater.  - Continue digoxin 0.125 daily with low output.  - Continue hydralazine  12.5 three times a day + 15 mg imdur - Increase spiro to 25 mg daily.  - Suppress PVCs/NSVT =>amiodarone, see below.  - Hold off on entresto and consider at his next visit. I am concerning when he goes back to work he may gey dehdyrated.  -  Check BMET today and next visit.   2. PVCs/NSVT: Very frequent. ?Cause of cardiomyopathy versus result of cardiomyopathy. Decreased PVCs now on amiodarone.  -No PVCs today on EKG - Cut back amio to 200 mg daily.  - He will need yearly eye exams. Check TSH/LFTs next visit.  - Will need Lifevest at discharge.  - Check K and Mag today   3. Smoking: No longer smoking   Follow up in 2 weeks. Check BMET today and in 7 days. Continue Life Vest. Today he was provided with a letter for work. He should hold off on returning to work.   Plan to repeat ECHO after HF meds optimized. ---> February.  Greater than 50% of the (total minutes 25 visit spent in counseling/coordination of care regarding the above. )    Amy Clegg NP-C  1:34 PM

## 2019-03-20 NOTE — Patient Instructions (Signed)
Take  Lasix only as needed if weight is greater than 138lbs.  DECREASE amiodarone to 200mg  daily.   INCREASE Spironolactone to 25 mg daily.  Follow up in 2 weeks.

## 2019-04-03 ENCOUNTER — Ambulatory Visit (HOSPITAL_COMMUNITY)
Admission: RE | Admit: 2019-04-03 | Discharge: 2019-04-03 | Disposition: A | Discharge status: Home / Self Care | Payer: 59 | Source: Ambulatory Visit | Attending: Adult Health | Admitting: Adult Health

## 2019-04-03 ENCOUNTER — Other Ambulatory Visit: Payer: Self-pay

## 2019-04-03 ENCOUNTER — Encounter (HOSPITAL_COMMUNITY): Payer: Self-pay

## 2019-04-03 VITALS — BP 123/73 | HR 80 | Wt 142.4 lb

## 2019-04-03 DIAGNOSIS — Z79899 Other long term (current) drug therapy: Secondary | ICD-10-CM | POA: Diagnosis not present

## 2019-04-03 DIAGNOSIS — I472 Ventricular tachycardia: Secondary | ICD-10-CM | POA: Insufficient documentation

## 2019-04-03 DIAGNOSIS — D509 Iron deficiency anemia, unspecified: Secondary | ICD-10-CM | POA: Insufficient documentation

## 2019-04-03 DIAGNOSIS — I493 Ventricular premature depolarization: Secondary | ICD-10-CM | POA: Insufficient documentation

## 2019-04-03 DIAGNOSIS — Z87891 Personal history of nicotine dependence: Secondary | ICD-10-CM | POA: Insufficient documentation

## 2019-04-03 DIAGNOSIS — I5022 Chronic systolic (congestive) heart failure: Secondary | ICD-10-CM | POA: Diagnosis present

## 2019-04-03 MED ORDER — ISOSORBIDE MONONITRATE ER 30 MG PO TB24: 30.0000 mg | ORAL_TABLET | Freq: Every day | ORAL | 1 refills | Status: DC

## 2019-04-03 MED ORDER — HYDRALAZINE HCL 25 MG PO TABS: 25.0000 mg | ORAL_TABLET | Freq: Three times a day (TID) | ORAL | 1 refills | Status: DC

## 2019-04-03 NOTE — Addendum Note (Signed)
Encounter addended by: Kerry Dory, CMA on: 04/03/2019 12:47 PM  Actions taken: Letter saved

## 2019-04-03 NOTE — Patient Instructions (Signed)
INCREASE Hydralazine to 25 mg, one tab three times daily INCREASE Isosorbide to 30 mg, one tab daily  Your physician recommends that you schedule a follow-up appointment in: 4 weeks  in the Advanced Practitioners (PA/NP) Clinic   Do the following things EVERYDAY: 1) Weigh yourself in the morning before breakfast. Write it down and keep it in a log. 2) Take your medicines as prescribed 3) Eat low salt foods-Limit salt (sodium) to 2000 mg per day.  4) Stay as active as you can everyday 5) Limit all fluids for the day to less than 2 liters  At the [facility], you and your health needs are our priority. As part of our continuing mission to provide you with exceptional heart care, we have created designated Provider Care Teams. These Care Teams include your primary Cardiologist (physician) and Advanced Practice Providers (APPs- Physician Assistants and Nurse Practitioners) who all work together to provide you with the care you need, when you need it.   You may see any of the following providers on your designated Care Team at your next follow up: Marland Kitchen Dr Glori Bickers . Dr Loralie Champagne . Darrick Grinder, NP . Lyda Jester, PA . Audry Riles, PharmD   Please be sure to bring in all your medications bottles to every appointment.

## 2019-04-03 NOTE — Progress Notes (Signed)
PCP: Primary Cardiologist: Dr Aundra Dubin   HPI: Mr Noorani is a 63 year old with a history of iron deficient anemia, smoker ( 1 pack every 2 days ), newly diagnosed acute systolic heart failure, and newly identified PVCs.   Admitted 03/2019 increased dyspnea. ECHO showed reducued EF 25-30%. LHC/RHC completed with  completed  Low cardiac ouput/index, and no significant coronary diesase. While hospitalized he had high PVC burden so amio was started. Discharged with Life Vest.   Today he returns for HF follow up.Overall feeling fine. Mild dyspnea with exertion but feels better. Denies PND/Orthopnea. Appetite ok. No fever or chills. Weight at home 133-135 pounds. SBP at home 120-130. Wearing Life Vest. Taking all medications. Currently not working.   Zoll Interrogation: No events. Wear time ~23 hours. Average heart rate 65. Activity 5374 steps.   SH: Stopped drinking in November in Drinks wine coolers on the weekend only 5-6. 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 Lives with his wife.    PMH 1. IDA 2. Tobacco Abuse 3. Systolic HF, NICM 26/7/12 ECHO EF 25-30%  4. PVCs   RHC 03/12/19  RA mean 3 RV 37/4 PA 41/21, mean 29 PCWP mean 17 LV 103/11 AO 107/67 Oxygen saturations: PA 56% AO 95% Cardiac Output (Fick) 3.2  Cardiac Index (Fick) 1.8 PVR 3.75 WU ROS: All systems negative except as listed in HPI, PMH and Problem List.  SH:  Social History   Socioeconomic History  . Marital status: Married    Spouse name: Not on file  . Number of children: Not on file  . Years of education: Not on file  . Highest education level: Not on file  Occupational History  . Not on file  Social Needs  . Financial resource strain: Not on file  . Food insecurity    Worry: Not on file    Inability: Not on file  . Transportation needs    Medical: Not on file    Non-medical: Not on file  Tobacco Use  . Smoking status: Current Every Day Smoker    Years: 9.00    Types:  Cigarettes  . Smokeless tobacco: Never Used  Substance and Sexual Activity  . Alcohol use: Yes    Comment: one day a week   . Drug use: Never  . Sexual activity: Not on file  Lifestyle  . Physical activity    Days per week: Not on file    Minutes per session: Not on file  . Stress: Not on file  Relationships  . Social Herbalist on phone: Not on file    Gets together: Not on file    Attends religious service: Not on file    Active member of club or organization: Not on file    Attends meetings of clubs or organizations: Not on file    Relationship status: Not on file  . Intimate partner violence    Fear of current or ex partner: Not on file    Emotionally abused: Not on file    Physically abused: Not on file    Forced sexual activity: Not on file  Other Topics Concern  . Not on file  Social History Narrative  . Not on file    FH: No family history on file.  Past Medical History:  Diagnosis Date  . CHF (congestive heart failure) ([place])    new 03/2019  . Dyspnea     Current Outpatient Medications  Medication Sig  Dispense Refill  . acetaminophen (TYLENOL) 500 MG tablet Take 1,000 mg by mouth every 6 (six) hours as needed for mild pain.    Marland Kitchen amiodarone (PACERONE) 200 MG tablet Take 1 tablet (200 mg total) by mouth daily. 30 tablet 0  . Chlorphen-Phenyleph-ASA (ALKA-SELTZER PLUS COLD) 2-7.8-325 MG TBEF Take 2 tablets by mouth every 6 (six) hours as needed (cold).    Marland Kitchen digoxin (LANOXIN) 0.125 MG tablet Take 1 tablet (0.125 mg total) by mouth daily. 30 tablet 0  . DM-Phenylephrine-Acetaminophen (THERAFLU EXPRESSMAX) 10-5-325 MG/15ML LIQD Take 15 mLs by mouth every 8 (eight) hours as needed (cold symptoms).    . ferrous sulfate 325 (65 FE) MG tablet Take 325 mg by mouth daily with breakfast.    . furosemide (LASIX) 40 MG tablet Take 1 tablet (40 mg total) by mouth as needed. 30 tablet 0  . hydrALAZINE (APRESOLINE) 25 MG tablet Take 0.5 tablets (12.5 mg total) by  mouth 3 (three) times daily. 120 tablet 1  . ibuprofen (ADVIL) 200 MG tablet Take 400 mg by mouth every 6 (six) hours as needed for moderate pain.    . isosorbide mononitrate (IMDUR) 30 MG 24 hr tablet Take 0.5 tablets (15 mg total) by mouth daily. 30 tablet 1  . pantoprazole (PROTONIX) 40 MG tablet Take 40 mg by mouth daily.    Marland Kitchen spironolactone (ALDACTONE) 25 MG tablet Take 1 tablet (25 mg total) by mouth daily. 30 tablet 3  . vitamin C (ASCORBIC ACID) 500 MG tablet Take 500 mg by mouth daily.     No current facility-administered medications for this encounter.     Vitals:   04/03/19 1156  BP: 123/73  Pulse: 80  SpO2: 100%  Weight: 64.6 kg (142 lb 6.4 oz)   Wt Readings from Last 3 Encounters:  04/03/19 64.6 kg (142 lb 6.4 oz)  03/20/19 63.3 kg (139 lb 9.6 oz)  03/12/19 60.1 kg (132 lb 8 oz)    PHYSICAL EXAM: General:  Well appearing. No resp difficulty HEENT: normal Neck: supple. JVP flat. Carotids 2+ bilaterally; no bruits. No lymphadenopathy or thryomegaly appreciated. Cor: PMI normal. Regular rate & rhythm. No rubs, gallops or murmurs. Life Vest on. Lungs: clear Abdomen: soft, nontender, non stended. No hepatosplenomegaly. No bruits or masses. Good bowel sounds. Extremities: no cyanosis, clubbing, rash, edema Neuro: alert & orientedx3, cranial nerves grossly intact. Moves all 4 extremities w/o difficulty. Affect pleasant.  ASSESSMENT & PLAN: 1. Chronic Systolic c CHF: Newly noted cardiomyopathy with severe LV dilation, 03/10/19 EF 25-30%, RV ok.   No family history of cardiomyopathy. No significant coronary disease on cath today. No definite pre-existing viral symptoms. It is possible that this could be a PVC-mediated CMP (or that the cardiomyopathy itself could be driving the PVCs). RHC showed fairly well optimized filling pressures, cardiac index low at 1.8.  LHC - no significant coronary diseased.  - NYHA II. Volume status stable. Continue lasix as needed.  -  Continue digoxin 0.125 daily with low output.  - Increase hydralazine  25 mg three times a day + 30 mg imdur daily.  - Continue spiro to 25 mg daily.  - Suppress PVCs/NSVT =>amiodarone, see below.  - Next visit consider entresto.     2. PVCs/NSVT: Very frequent. ?Cause of cardiomyopathy versus result of cardiomyopathy. Decreased PVCs now on amiodarone.  - Continue amio to 200 mg daily.  - He will need yearly eye exams.  - Continue Life Vest .    3. Smoking: No  longer smoking   I personally called [employer] for replacement Vest.  Needs to remain out of work for now. Provided with letter for work.  Follow up in 4 weeks and consider entresto at that time.   Greater than 50% of the (total minutes 25) visit spent in counseling/coordination of care regarding the above.    Mckinze Poirier NP-C  12:02 PM

## 2019-04-07 ENCOUNTER — Telehealth (HOSPITAL_COMMUNITY): Payer: Self-pay

## 2019-04-07 MED ORDER — DIGOXIN 125 MCG PO TABS: 0.1250 mg | ORAL_TABLET | Freq: Every day | ORAL | 5 refills | Status: DC

## 2019-04-07 MED ORDER — AMIODARONE HCL 200 MG PO TABS: 200.0000 mg | ORAL_TABLET | Freq: Every day | ORAL | 5 refills | Status: DC

## 2019-04-07 NOTE — Telephone Encounter (Signed)
Received call from wife of patient stating that pharmacy said it was too soon to refill some of patients medications.  She says at last visit, doses were changed for hydralazine and Imdur so she didn't understand why she couldn't get new prescriptions. Called [facility] they didn't know what to make of it. They will call [facility] pharmacy at [facility] and call me back.

## 2019-04-07 NOTE — Telephone Encounter (Signed)
[facility] called back, they were able to clear the issue.  TOC realized they gave half tabs of meds and therefore they can override for new dose of whole tabs of all meds.  They state patient can pick up meds today.  Wife made aware of same.  Verbalized appreciation.

## 2019-04-15 ENCOUNTER — Encounter: Payer: Self-pay | Admitting: Nurse Practitioner

## 2019-04-15 ENCOUNTER — Other Ambulatory Visit: Payer: Self-pay

## 2019-04-15 ENCOUNTER — Ambulatory Visit: Payer: 59 | Attending: Nurse Practitioner | Admitting: Nurse Practitioner

## 2019-04-15 DIAGNOSIS — I5022 Chronic systolic (congestive) heart failure: Secondary | ICD-10-CM | POA: Diagnosis not present

## 2019-04-15 DIAGNOSIS — I472 Ventricular tachycardia, unspecified: Secondary | ICD-10-CM

## 2019-04-15 DIAGNOSIS — I10 Essential (primary) hypertension: Secondary | ICD-10-CM | POA: Diagnosis not present

## 2019-04-15 NOTE — Progress Notes (Signed)
Virtual Visit via Telephone Note Due to national recommendations of social distancing due to COVID 19, telehealth visit is felt to be most appropriate for this patient at this time.  I discussed the limitations, risks, security and privacy concerns of performing an evaluation and management service by telephone and the availability of in person appointments. I also discussed with the patient that there may be a patient responsible charge related to this service. The patient expressed understanding and agreed to proceed.    I connected with Logan Lopez on 04/15/19  at   3:30 PM EST  EDT by telephone and verified that I am speaking with the correct person using two identifiers.   Consent I discussed the limitations, risks, security and privacy concerns of performing an evaluation and management service by telephone and the availability of in person appointments. I also discussed with the patient that there may be a patient responsible charge related to this service. The patient expressed understanding and agreed to proceed.   Location of Patient: Private Residence   Location of Provider: [facility]    Persons participating in Telemedicine visit: [name] FNP-BC YY [place] CMA Logan Lopez    History of Present Illness: Telemedicine visit for: Establish Care  has a past medical history of CHF (congestive heart failure) (HCC), Dyspnea, and Hypertension.  Essential Hypertension Monitoring blood pressure at home. Average readings 120-130/70s. Denies chest pain, palpitations, lightheadedness, dizziness, headaches or BLE edema. Endorses mild dyspnea mostly with exertion/activity. Taking hydralazine 25 mg TID, imdur 30 mg daily, spironolactone 25 mg daily.    CHF Taking amiodarone 200 mg daily for PVCs. Digoxin for low output.  He has newly diagnosed CHF (grade 2 diastolic dysfunction). His CHF was an incidental finding last month after being admitted  to the hospital with epigastric pain, orthopnea and BLE edema. ECHO 25-30%  Cardiac cath: NICM. Currently with lifevest. Seeing Dr. Shirlee Latch for his CHF. Next appt 4 weeks.  BP Readings from Last 3 Encounters:  04/03/19 123/73  03/20/19 134/72  03/12/19 102/67     Past Medical History:  Diagnosis Date  . CHF (congestive heart failure) (HCC)    new 03/2019  . Dyspnea   . Hypertension     Past Surgical History:  Procedure Laterality Date  . RIGHT/LEFT HEART CATH AND CORONARY ANGIOGRAPHY N/A 03/12/2019   Procedure: RIGHT/LEFT HEART CATH AND CORONARY ANGIOGRAPHY;  Surgeon: Laurey Morale, MD;  Location: [facility] INVASIVE CV LAB;  Service: Cardiovascular;  Laterality: N/A;  . WISDOM TOOTH EXTRACTION      Family History  Problem Relation Age of Onset  . Hypertension Neg Hx     Social History   Socioeconomic History  . Marital status: Married    Spouse name: Not on file  . Number of children: Not on file  . Years of education: Not on file  . Highest education level: Not on file  Occupational History  . Not on file  Tobacco Use  . Smoking status: Former Smoker    Years: 9.00    Types: Cigarettes  . Smokeless tobacco: Never Used  Substance and Sexual Activity  . Alcohol use: Not Currently    Comment: one day a week   . Drug use: Never  . Sexual activity: Not Currently  Other Topics Concern  . Not on file  Social History Narrative  . Not on file   Social Determinants of Health   Financial Resource Strain:   . Difficulty of Paying  Living Expenses: Not on file  Food Insecurity:   . Worried About Charity fundraiser in the Last Year: Not on file  . Ran Out of Food in the Last Year: Not on file  Transportation Needs:   . Lack of Transportation (Medical): Not on file  . Lack of Transportation (Non-Medical): Not on file  Physical Activity:   . Days of Exercise per Week: Not on file  . Minutes of Exercise per Session: Not on file  Stress:   . Feeling of Stress : Not on file   Social Connections:   . Frequency of Communication with Friends and Family: Not on file  . Frequency of Social Gatherings with Friends and Family: Not on file  . Attends Religious Services: Not on file  . Active Member of Clubs or Organizations: Not on file  . Attends Archivist Meetings: Not on file  . Marital Status: Not on file     Observations/Objective: Awake, alert and oriented x 3   Review of Systems  Constitutional: Negative for fever, malaise/fatigue and weight loss.  HENT: Negative.  Negative for nosebleeds.   Eyes: Negative.  Negative for blurred vision, double vision and photophobia.  Respiratory: Positive for shortness of breath. Negative for cough.   Cardiovascular: Negative.  Negative for chest pain, palpitations and leg swelling.  Gastrointestinal: Negative.  Negative for heartburn, nausea and vomiting.  Musculoskeletal: Negative.  Negative for myalgias.  Neurological: Negative.  Negative for dizziness, focal weakness, seizures and headaches.  Psychiatric/Behavioral: Negative.  Negative for suicidal ideas.    Assessment and Plan: Logan Lopez was seen today for new patient (initial visit).  Diagnoses and all orders for this visit:  Essential hypertension Continue all antihypertensives as prescribed.  Remember to bring in your blood pressure log with you for your follow up appointment.  DASH/Mediterranean Diets are healthier choices for HTN.    Chronic systolic heart failure ([place]) Follow up with Cardiology as instructed Continue all medications as prescribed  Ventricular tachycardia ([place]) Continue life vest as instructed Continue all medications as prescribed.  Avoid smoking and ETOH use    Follow Up Instructions Return in about 4 weeks (around 05/13/2019).     I discussed the assessment and treatment plan with the patient. The patient was provided an opportunity to ask questions and all were answered. The patient agreed with the plan and  demonstrated an understanding of the instructions.   The patient was advised to call back or seek an in-person evaluation if the symptoms worsen or if the condition fails to improve as anticipated.  I provided 22 minutes of non-face-to-face time during this encounter including median intraservice time, reviewing previous notes, labs, imaging, medications and explaining diagnosis and management.  Gildardo Pounds, FNP-BC

## 2019-04-18 ENCOUNTER — Other Ambulatory Visit: Payer: Self-pay

## 2019-04-18 ENCOUNTER — Ambulatory Visit: Payer: PRIVATE HEALTH INSURANCE | Attending: Nurse Practitioner

## 2019-04-18 DIAGNOSIS — Z0000 Encounter for general adult medical examination without abnormal findings: Secondary | ICD-10-CM

## 2019-04-19 LAB — PSA: Prostate Specific Ag, Serum: 1.7 ng/mL (ref 0.0–4.0)

## 2019-04-19 LAB — COMPREHENSIVE METABOLIC PANEL
ALT: 56 IU/L — ABNORMAL HIGH (ref 0–44)
AST: 50 IU/L — ABNORMAL HIGH (ref 0–40)
Albumin/Globulin Ratio: 1.3 (ref 1.2–2.2)
Albumin: 4.4 g/dL (ref 3.8–4.8)
Alkaline Phosphatase: 166 IU/L — ABNORMAL HIGH (ref 39–117)
BUN/Creatinine Ratio: 13 (ref 10–24)
BUN: 15 mg/dL (ref 8–27)
Bilirubin Total: 0.3 mg/dL (ref 0.0–1.2)
CO2: 28 mmol/L (ref 20–29)
Calcium: 10.3 mg/dL — ABNORMAL HIGH (ref 8.6–10.2)
Chloride: 97 mmol/L (ref 96–106)
Creatinine, Ser: 1.14 mg/dL (ref 0.76–1.27)
GFR calc Af Amer: 80 mL/min/{1.73_m2} (ref >59)
GFR calc non Af Amer: 69 mL/min/{1.73_m2} (ref >59)
Globulin, Total: 3.4 g/dL (ref 1.5–4.5)
Glucose: 112 mg/dL — ABNORMAL HIGH (ref 65–99)
Potassium: 5.2 mmol/L (ref 3.5–5.2)
Sodium: 138 mmol/L (ref 134–144)
Total Protein: 7.8 g/dL (ref 6.0–8.5)

## 2019-05-04 ENCOUNTER — Encounter: Payer: Self-pay | Admitting: Nurse Practitioner

## 2019-05-04 DIAGNOSIS — I472 Ventricular tachycardia, unspecified: Secondary | ICD-10-CM | POA: Insufficient documentation

## 2019-05-06 ENCOUNTER — Ambulatory Visit (HOSPITAL_COMMUNITY)
Admission: RE | Admit: 2019-05-06 | Discharge: 2019-05-06 | Disposition: A | Discharge status: Home / Self Care | Payer: 59 | Source: Ambulatory Visit | Attending: Cardiology | Admitting: Cardiology

## 2019-05-06 ENCOUNTER — Other Ambulatory Visit: Payer: Self-pay

## 2019-05-06 ENCOUNTER — Encounter (HOSPITAL_COMMUNITY): Payer: Self-pay

## 2019-05-06 ENCOUNTER — Telehealth (HOSPITAL_COMMUNITY): Payer: Self-pay | Admitting: Cardiology

## 2019-05-06 VITALS — BP 140/80 | HR 72 | Wt 141.8 lb

## 2019-05-06 DIAGNOSIS — Z87891 Personal history of nicotine dependence: Secondary | ICD-10-CM | POA: Insufficient documentation

## 2019-05-06 DIAGNOSIS — Z79899 Other long term (current) drug therapy: Secondary | ICD-10-CM | POA: Insufficient documentation

## 2019-05-06 DIAGNOSIS — I110 Hypertensive heart disease with heart failure: Secondary | ICD-10-CM | POA: Diagnosis not present

## 2019-05-06 DIAGNOSIS — I493 Ventricular premature depolarization: Secondary | ICD-10-CM | POA: Insufficient documentation

## 2019-05-06 DIAGNOSIS — I429 Cardiomyopathy, unspecified: Secondary | ICD-10-CM | POA: Diagnosis not present

## 2019-05-06 DIAGNOSIS — I5022 Chronic systolic (congestive) heart failure: Secondary | ICD-10-CM | POA: Diagnosis present

## 2019-05-06 LAB — COMPREHENSIVE METABOLIC PANEL
ALT: 51 U/L — ABNORMAL HIGH (ref 0–44)
AST: 47 U/L — ABNORMAL HIGH (ref 15–41)
Albumin: 3.8 g/dL (ref 3.5–5.0)
Alkaline Phosphatase: 115 U/L (ref 38–126)
Anion gap: 12 (ref 5–15)
BUN: 10 mg/dL (ref 8–23)
CO2: 24 mmol/L (ref 22–32)
Calcium: 9.5 mg/dL (ref 8.9–10.3)
Chloride: 103 mmol/L (ref 98–111)
Creatinine, Ser: 0.85 mg/dL (ref 0.61–1.24)
GFR calc Af Amer: >60 mL/min (ref >60)
GFR calc non Af Amer: >60 mL/min (ref >60)
Glucose, Bld: 93 mg/dL (ref 70–99)
Potassium: 4.1 mmol/L (ref 3.5–5.1)
Sodium: 139 mmol/L (ref 135–145)
Total Bilirubin: 0.6 mg/dL (ref 0.3–1.2)
Total Protein: 7.5 g/dL (ref 6.5–8.1)

## 2019-05-06 LAB — TSH: TSH: 0.845 u[IU]/mL (ref 0.350–4.500)

## 2019-05-06 LAB — DIGOXIN LEVEL: Digoxin Level: 0.8 ng/mL (ref 0.8–2.0)

## 2019-05-06 MED ORDER — SACUBITRIL-VALSARTAN 24-26 MG PO TABS: 1.0000 | ORAL_TABLET | Freq: Two times a day (BID) | ORAL | 3 refills | Status: DC

## 2019-05-06 NOTE — Progress Notes (Signed)
PCP: Primary Cardiologist: Dr Aundra Dubin   HPI: Logan Lopez is a 64 year old with a history of iron deficient anemia, former smoker ( 1 pack every 2 days ), newly diagnosed acute systolic heart failure, and newly identified PVCs.   Admitted 03/2019 increased dyspnea. ECHO showed reducued EF 25-30%. LHC/RHC completed and showed low cardiac ouput/index, and no significant coronary diesase. While hospitalized he had high PVC burden so amio was started. Discharged with Life Vest.   Today he returns for HF follow up. Here with his wife. Denies dyspnea. No wt gain but feels tired. Denies palpitations. No lightlessness/ dizziness, syncope/ near syncope. Reports full med compliance. No SEs. Compliant w/ LifeVest. No events based on interrogation. No shocks.   Zoll Interrogation: No recordings. No treatments.   SH: Stopped drinking in November in Drinks wine coolers on the weekend only 5-6. 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 Lives with his wife.    PMH 1. IDA 2. Tobacco Abuse 3. Systolic HF, NICM 54/6/27 ECHO EF 25-30%  4. PVCs   RHC 03/12/19  RA mean 3 RV 37/4 PA 41/21, mean 29 PCWP mean 17 LV 103/11 AO 107/67 Oxygen saturations: PA 56% AO 95% Cardiac Output (Fick) 3.2  Cardiac Index (Fick) 1.8 PVR 3.75 WU ROS: All systems negative except as listed in HPI, PMH and Problem List.  SH:  Social History   Socioeconomic History  . Marital status: Married    Spouse name: Not on file  . Number of children: Not on file  . Years of education: Not on file  . Highest education level: Not on file  Occupational History  . Not on file  Tobacco Use  . Smoking status: Former Smoker    Years: 9.00    Types: Cigarettes  . Smokeless tobacco: Never Used  Substance and Sexual Activity  . Alcohol use: Not Currently    Comment: one day a week   . Drug use: Never  . Sexual activity: Not Currently  Other Topics Concern  . Not on file  Social History Narrative  . Not on  file   Social Determinants of Health   Financial Resource Strain:   . Difficulty of Paying Living Expenses: Not on file  Food Insecurity:   . Worried About Charity fundraiser in the Last Year: Not on file  . Ran Out of Food in the Last Year: Not on file  Transportation Needs:   . Lack of Transportation (Medical): Not on file  . Lack of Transportation (Non-Medical): Not on file  Physical Activity:   . Days of Exercise per Week: Not on file  . Minutes of Exercise per Session: Not on file  Stress:   . Feeling of Stress : Not on file  Social Connections:   . Frequency of Communication with Friends and Family: Not on file  . Frequency of Social Gatherings with Friends and Family: Not on file  . Attends Religious Services: Not on file  . Active Member of Clubs or Organizations: Not on file  . Attends Archivist Meetings: Not on file  . Marital Status: Not on file  Intimate Partner Violence:   . Fear of Current or Ex-Partner: Not on file  . Emotionally Abused: Not on file  . Physically Abused: Not on file  . Sexually Abused: Not on file    FH:  Family History  Problem Relation Age of Onset  . Hypertension Neg Hx  Past Medical History:  Diagnosis Date  . CHF (congestive heart failure) (HCC)    new 03/2019  . Dyspnea   . Hypertension     Current Outpatient Medications  Medication Sig Dispense Refill  . acetaminophen (TYLENOL) 500 MG tablet Take 1,000 mg by mouth every 6 (six) hours as needed for mild pain.    Marland Kitchen amiodarone (PACERONE) 200 MG tablet Take 1 tablet (200 mg total) by mouth daily. 30 tablet 5  . Chlorphen-Phenyleph-ASA (ALKA-SELTZER PLUS COLD) 2-7.8-325 MG TBEF Take 2 tablets by mouth every 6 (six) hours as needed (cold).    Marland Kitchen digoxin (LANOXIN) 0.125 MG tablet Take 1 tablet (0.125 mg total) by mouth daily. 30 tablet 5  . DM-Phenylephrine-Acetaminophen (THERAFLU EXPRESSMAX) 10-5-325 MG/15ML LIQD Take 15 mLs by mouth every 8 (eight) hours as needed  (cold symptoms).    . ferrous sulfate 325 (65 FE) MG tablet Take 325 mg by mouth daily with breakfast.    . furosemide (LASIX) 40 MG tablet Take 1 tablet (40 mg total) by mouth as needed. 30 tablet 0  . hydrALAZINE (APRESOLINE) 25 MG tablet Take 1 tablet (25 mg total) by mouth 3 (three) times daily. 90 tablet 1  . ibuprofen (ADVIL) 200 MG tablet Take 400 mg by mouth every 6 (six) hours as needed for moderate pain.    . isosorbide mononitrate (IMDUR) 30 MG 24 hr tablet Take 1 tablet (30 mg total) by mouth daily. 30 tablet 1  . spironolactone (ALDACTONE) 25 MG tablet Take 1 tablet (25 mg total) by mouth daily. 30 tablet 3  . vitamin C (ASCORBIC ACID) 500 MG tablet Take 500 mg by mouth daily.     No current facility-administered medications for this encounter.    Vitals:   05/06/19 1206  BP: 140/80  Pulse: 72  SpO2: 99%  Weight: 64.3 kg (141 lb 12.8 oz)   Wt Readings from Last 3 Encounters:  05/06/19 64.3 kg (141 lb 12.8 oz)  04/03/19 64.6 kg (142 lb 6.4 oz)  03/20/19 63.3 kg (139 lb 9.6 oz)    PHYSICAL EXAM: General:  Well appearing. No respiratory difficulty HEENT: normal Neck: supple. no JVD. Carotids 2+ bilat; no bruits. No lymphadenopathy or thyromegaly appreciated. Cor: PMI nondisplaced. Regular rate & rhythm. No rubs, gallops or murmurs. Lungs: clear Abdomen: soft, nontender, nondistended. No hepatosplenomegaly. No bruits or masses. Good bowel sounds. Extremities: no cyanosis, clubbing, rash, edema Neuro: alert & oriented x 3, cranial nerves grossly intact. moves all 4 extremities w/o difficulty. Affect pleasant.  ASSESSMENT & PLAN: 1. Chronic Systolic c CHF: Newly noted cardiomyopathy with severe LV dilation, 03/10/19 EF 25-30%, RV ok.   No family history of cardiomyopathy. No significant coronary disease on cath today. No definite pre-existing viral symptoms. It is possible that this could be a PVC-mediated CMP (or that the cardiomyopathy itself could be driving the  PVCs). RHC showed fairly well optimized filling pressures, cardiac index low at 1.8.  LHC - no significant coronary diseased.  - Stable NYHA II. Volume status stable. Euvolemic on exam.  - Check CMP today. Plan to add Entresto 24/26 mg bid if SCr/K stable.   - Continue digoxin 0.125 daily with low output. Check digoxin level today - Continue hydralazine  25 mg three times a day + 30 mg imdur daily.  - Continue spiro to 25 mg daily.   - Suppress PVCs/NSVT =>amiodarone, see below.  - Will continue to gradually titrate HF regimen over the next several weeks. Once on optimal  medical therapy, will repeat echocardiogram. Will need to refer to EP for ICD if LVEF remains < 35%.   2. 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 ?Cause of cardiomyopathy versus result of cardiomyopathy. Decreased PVCs now on amiodarone.  - Continue amio to 200 mg daily.  - Check HFTs and TFTs today  - He will need yearly eye exams.  - Continue Life Vest .    3. Smoking: No longer smoking   F/u in 2 weeks for further med titration.    Althia Egolf PA-C  12:10 PM

## 2019-05-06 NOTE — Patient Instructions (Signed)
Lab work done today. We will notify you of any abnormal lab work. No news is good news!  START Entresto 24/26mg  tab two times daily.  Please follow up with the [facility] in 2 weeks.  At the [facility], you and your health needs are our priority. As part of our continuing mission to provide you with exceptional heart care, we have created designated Provider Care Teams. These Care Teams include your primary Cardiologist (physician) and Advanced Practice Providers (APPs- Physician Assistants and Nurse Practitioners) who all work together to provide you with the care you need, when you need it.   You may see any of the following providers on your designated Care Team at your next follow up: Marland Kitchen Dr Arvilla Meres . Dr Marca Ancona . Tonye Becket, NP . Robbie Lis, PA . Karle Plumber, PharmD   Please be sure to bring in all your medications bottles to every appointment.

## 2019-05-06 NOTE — Telephone Encounter (Signed)
COVID-19 pre-appointment screening questions:   Do you have a history of COVID-19 or a positive test result in the past 7-10 days?  NO  To the best of your knowledge, have you been in close contact with anyone with a confirmed diagnosis of COVID 19?  NO  Have you had any one or more of the following: Fever, chills, cough, shortness of breath (out of the normal for you) or any flu-like symptoms?   NO   Are you experiencing any of the following symptoms that is new or out of usual for you:  . Ear, nose or throat discomfort . Sore throat . Headache . Muscle Pain . Diarrhea . Loss of taste or smell  NO   Reviewed all the following with patient: . Use of hand sanitizer when entering the building . Everyone is required to wear a mask in the building, if you do not have a mask we are happy to provide you with one when you arrive . NO Visitor guidelines   If patient answers YES to any of questions they must change to a virtual visit and place note in comments about symptoms  

## 2019-05-09 NOTE — Progress Notes (Signed)
Spoke to patient and informed the advising.  Pt. Understood.

## 2019-05-16 ENCOUNTER — Telehealth (HOSPITAL_COMMUNITY): Payer: Self-pay

## 2019-05-16 NOTE — Telephone Encounter (Signed)
Called pt to make him aware that his paper work for secure financial is complete. Pt stated that he has more paper work to be filled out and that he will pick up his completed paper work on his next visit 1/25.

## 2019-05-19 ENCOUNTER — Ambulatory Visit: Payer: 59 | Admitting: Nurse Practitioner

## 2019-05-26 ENCOUNTER — Other Ambulatory Visit: Payer: Self-pay

## 2019-05-26 ENCOUNTER — Ambulatory Visit (HOSPITAL_COMMUNITY)
Admission: RE | Admit: 2019-05-26 | Discharge: 2019-05-26 | Disposition: A | Discharge status: Home / Self Care | Payer: 59 | Source: Ambulatory Visit | Attending: Adult Health | Admitting: Adult Health

## 2019-05-26 ENCOUNTER — Encounter (HOSPITAL_COMMUNITY): Payer: Self-pay

## 2019-05-26 ENCOUNTER — Telehealth (HOSPITAL_COMMUNITY): Payer: Self-pay

## 2019-05-26 VITALS — BP 130/70 | HR 70 | Wt 145.8 lb

## 2019-05-26 DIAGNOSIS — R5383 Other fatigue: Secondary | ICD-10-CM | POA: Diagnosis not present

## 2019-05-26 DIAGNOSIS — I472 Ventricular tachycardia: Secondary | ICD-10-CM | POA: Insufficient documentation

## 2019-05-26 DIAGNOSIS — Z87891 Personal history of nicotine dependence: Secondary | ICD-10-CM | POA: Insufficient documentation

## 2019-05-26 DIAGNOSIS — I5022 Chronic systolic (congestive) heart failure: Secondary | ICD-10-CM | POA: Diagnosis present

## 2019-05-26 DIAGNOSIS — Z79899 Other long term (current) drug therapy: Secondary | ICD-10-CM | POA: Insufficient documentation

## 2019-05-26 DIAGNOSIS — I110 Hypertensive heart disease with heart failure: Secondary | ICD-10-CM | POA: Insufficient documentation

## 2019-05-26 DIAGNOSIS — I493 Ventricular premature depolarization: Secondary | ICD-10-CM | POA: Insufficient documentation

## 2019-05-26 DIAGNOSIS — I509 Heart failure, unspecified: Secondary | ICD-10-CM

## 2019-05-26 MED ORDER — CARVEDILOL 3.125 MG PO TABS: 3.1250 mg | ORAL_TABLET | Freq: Two times a day (BID) | ORAL | 3 refills | Status: DC

## 2019-05-26 NOTE — Patient Instructions (Addendum)
START Carvedilol 3.125mg  tab two times daily.  Your provider has recommended that you have a home sleep study.  [employer] is the company that does these test.  They will contact you by phone and must speak with you before they can ship the equipment.  Once they have spoken with you they will send the equipment right to your home with instructions on how to set it up.  Once you have completed the test you just dispose of the equipment, the information is automatically uploaded to [place] via blue-tooth technology.  IF you have any questions or issues with the equipment please call the company directly at [phone].  If your test is positive for sleep apnea and you need a home CPAP machine you will be contacted by Dr Norris Cross office [facility]) to set this up.   Your physician has requested that you have an echocardiogram. Echocardiography is a painless test that uses sound waves to create images of your heart. It provides your doctor with information about the size and shape of your heart and how well your heart's chambers and valves are working. This procedure takes approximately one hour. There are no restrictions for this procedure. This will be done at your follow up appointment.  Please follow up with the [facility] in 3-4 weeks with an echocardiogram.  At the [facility], you and your health needs are our priority. As part of our continuing mission to provide you with exceptional heart care, we have created designated Provider Care Teams. These Care Teams include your primary Cardiologist (physician) and Advanced Practice Providers (APPs- Physician Assistants and Nurse Practitioners) who all work together to provide you with the care you need, when you need it.   You may see any of the following providers on your designated Care Team at your next follow up: Marland Kitchen Dr Arvilla Meres . Dr Marca Ancona . Tonye Becket, NP . Robbie Lis, PA . Karle Plumber,  PharmD   Please be sure to bring in all your medications bottles to every appointment.

## 2019-05-26 NOTE — Telephone Encounter (Signed)
Height:     Weight:145.8 BMI:19.77  Today's Date:05/26/2019  STOP BANG RISK ASSESSMENT S (snore) Have you been told that you snore?     NO   T (tired) Are you often tired, fatigued, or sleepy during the day?   YES  O (obstruction) Do you stop breathing, choke, or gasp during sleep? NO   P (pressure) Do you have or are you being treated for high blood pressure? NO   B (BMI) Is your body index greater than 35 kg/m? NO   A (age) Are you 62 years old or older? YES   N (neck) Do you have a neck circumference greater than 16 inches?   NO   G (gender) Are you a male? YES   TOTAL STOP/BANG "YES" ANSWERS 3                                                                       For Office Use Only              Procedure Order Form    YES to 3+ Stop Bang questions OR two clinical symptoms - patient qualifies for WatchPAT (CPT 95800)      Clinical Notes: Will consult Sleep Specialist and refer for management of therapy due to patient increased risk of Sleep Apnea. Ordering a sleep study due to the following two clinical symptoms: Excessive daytime sleepiness G47.10  / Difficulty concentrating R41.840  /  Unrefreshed by sleep G47.8   Which test do you need, WP1   . Do you have access to a smart device containing the app stores? yes  If YES, then -->WP1

## 2019-05-26 NOTE — Progress Notes (Signed)
PCP: [facility].  Primary Cardiologist: Dr Shirlee Latch   HPI: Logan Lopez is a 64 year old with a history of iron deficient anemia, former smoker ( 1 pack every 2 days ), newly diagnosed acute systolic heart failure, and newly identified PVCs.   Admitted 03/2019 increased dyspnea. ECHO showed reducued EF 25-30%. LHC/RHC completed and showed low cardiac ouput/index, and no significant coronary diesase. While hospitalized he had high PVC burden so amio was started. Discharged with Life Vest.   Today he returns for HF follow up. Last visit entresto 24-26 mg twice a day. Overall feeling fine. Complaining of day time fatigue. Having a hard time sleeping. Able to walk 2-3 steps without difficulty. Denies SOB/PND/Orthopnea. Appetite ok. No fever or chills. Weight at home has been stable. Taking all medications. Wearing Life Vest all the time. Not working. Trying to get disability. No longer smoking.   Zoll Interrogation: No events.   SH: Stopped drinking in November in Drinks wine coolers on the weekend only 5-6. 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 Lives with his wife.    PMH 1. IDA 2. Tobacco Abuse 3. Systolic HF, NICM 03/10/19 ECHO EF 25-30%  4. PVCs   RHC 03/12/19  RA mean 3 RV 37/4 PA 41/21, mean 29 PCWP mean 17 LV 103/11 AO 107/67 Oxygen saturations: PA 56% AO 95% Cardiac Output (Fick) 3.2  Cardiac Index (Fick) 1.8 PVR 3.75 WU ROS: All systems negative except as listed in HPI, PMH and Problem List.  SH:  Social History   Socioeconomic History  . Marital status: Married    Spouse name: Not on file  . Number of children: Not on file  . Years of education: Not on file  . Highest education level: Not on file  Occupational History  . Not on file  Tobacco Use  . Smoking status: Former Smoker    Years: 9.00    Types: Cigarettes  . Smokeless tobacco: Never Used  Substance and Sexual Activity  . Alcohol use: Not Currently    Comment:  one day a week   . Drug use: Never  . Sexual activity: Not Currently  Other Topics Concern  . Not on file  Social History Narrative  . Not on file   Social Determinants of Health   Financial Resource Strain:   . Difficulty of Paying Living Expenses: Not on file  Food Insecurity:   . Worried About Programme researcher, broadcasting/film/video in the Last Year: Not on file  . Ran Out of Food in the Last Year: Not on file  Transportation Needs:   . Lack of Transportation (Medical): Not on file  . Lack of Transportation (Non-Medical): Not on file  Physical Activity:   . Days of Exercise per Week: Not on file  . Minutes of Exercise per Session: Not on file  Stress:   . Feeling of Stress : Not on file  Social Connections:   . Frequency of Communication with Friends and Family: Not on file  . Frequency of Social Gatherings with Friends and Family: Not on file  . Attends Religious Services: Not on file  . Active Member of Clubs or Organizations: Not on file  . Attends Banker Meetings: Not on file  . Marital Status: Not on file  Intimate Partner Violence:   . Fear of Current or Ex-Partner: Not on file  . Emotionally Abused: Not on file  . Physically Abused: Not on file  .  Sexually Abused: Not on file    FH:  Family History  Problem Relation Age of Onset  . Hypertension Neg Hx     Past Medical History:  Diagnosis Date  . CHF (congestive heart failure) (HCC)    new 03/2019  . Dyspnea   . Hypertension     Current Outpatient Medications  Medication Sig Dispense Refill  . acetaminophen (TYLENOL) 500 MG tablet Take 1,000 mg by mouth every 6 (six) hours as needed for mild pain.    Marland Kitchen amiodarone (PACERONE) 200 MG tablet Take 1 tablet (200 mg total) by mouth daily. 30 tablet 5  . digoxin (LANOXIN) 0.125 MG tablet Take 1 tablet (0.125 mg total) by mouth daily. 30 tablet 5  . ferrous sulfate 325 (65 FE) MG tablet Take 325 mg by mouth daily with breakfast.    . hydrALAZINE (APRESOLINE) 25 MG  tablet Take 1 tablet (25 mg total) by mouth 3 (three) times daily. 90 tablet 1  . isosorbide mononitrate (IMDUR) 30 MG 24 hr tablet Take 1 tablet (30 mg total) by mouth daily. 30 tablet 1  . sacubitril-valsartan (ENTRESTO) 24-26 MG Take 1 tablet by mouth 2 (two) times daily. 180 tablet 3  . spironolactone (ALDACTONE) 25 MG tablet Take 1 tablet (25 mg total) by mouth daily. 30 tablet 3  . vitamin C (ASCORBIC ACID) 500 MG tablet Take 500 mg by mouth daily.    . furosemide (LASIX) 40 MG tablet Take 1 tablet (40 mg total) by mouth as needed. (Patient not taking: Reported on 05/26/2019) 30 tablet 0   No current facility-administered medications for this encounter.    Vitals:   05/26/19 1154  BP: 130/70  Pulse: 70  SpO2: 99%  Weight: 66.1 kg (145 lb 12.8 oz)   Wt Readings from Last 3 Encounters:  05/26/19 66.1 kg (145 lb 12.8 oz)  05/06/19 64.3 kg (141 lb 12.8 oz)  04/03/19 64.6 kg (142 lb 6.4 oz)    PHYSICAL EXAM: General:  Well appearing. No resp difficulty. Life Vest on.  HEENT: normal Neck: supple. no JVD. Carotids 2+ bilat; no bruits. No lymphadenopathy or thryomegaly appreciated. Cor: PMI nondisplaced. Regular rate & rhythm. No rubs, gallops or murmurs. Lungs: clear Abdomen: soft, nontender, nondistended. No hepatosplenomegaly. No bruits or masses. Good bowel sounds. Extremities: no cyanosis, clubbing, rash, edema. Clubbing in R/L fingers.  Neuro: alert & orientedx3, cranial nerves grossly intact. moves all 4 extremities w/o difficulty. Affect pleasant  ASSESSMENT & PLAN: 1. Chronic Systolic CHF: Newly noted cardiomyopathy with severe LV dilation, 03/10/19 EF 25-30%, RV ok.   No family history of cardiomyopathy. No significant coronary disease on cath today. No definite pre-existing viral symptoms. It is possible that this could be a PVC-mediated CMP (or that the cardiomyopathy itself could be driving the PVCs). RHC showed fairly well optimized filling pressures, cardiac index  low at 1.8.  LHC - no significant coronary diseased.  - NYHA II. Volume status stable. Does not need lasix.  - Add carvedilol 3.125 mg twice a day.   -  Continue digoxin 0.125 daily with low output. Dig level 0.9--> 05/06/19 - Continue hydralazine  25 mg three times a day + 30 mg imdur daily.  - Continue spiro to 25 mg daily.   - Consider SGL2i next visit.  - Suppress PVCs/NSVT =>amiodarone, see below. - Repeat ECHO next visit. If EF remains < 35% will need to refer to EP.   2. 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 ?Cause of cardiomyopathy  versus result of cardiomyopathy. Decreased PVCs now on amiodarone.  - Continue amio to 200 mg daily.  - Plan to follow amiodarone screening per guidelines  -.  TSH 0.845 and LFTs stable on 05/06/19.  - Will need yearly CXR, TSH Free T3 Free T4, LFTS, and eye exams.  - Discussed with patient.  - Continue Life Vest .  - Repeat ECHO next vist.   3. Smoking: No longer smoking   4. Daytime Fatigue  Set up for home sleep study.   Follow up 3-4 weeks with Dr Aundra Dubin and an ECHO. Greater than 50% of the (total minutes 25) visit spent in counseling/coordination of care regarding the above plan and test.   Logan Liebig NP-C  12:19 PM

## 2019-05-26 NOTE — Addendum Note (Signed)
Encounter addended by: Nicole Cella, RN on: 05/26/2019 12:41 PM  Actions taken: Order list changed, Diagnosis association updated, Clinical Note Signed

## 2019-05-27 ENCOUNTER — Telehealth (HOSPITAL_COMMUNITY): Payer: Self-pay

## 2019-05-27 NOTE — Telephone Encounter (Signed)
Faxed ov notes, order, stop bang and demographics to [facility]. Faxed to [phone].

## 2019-05-30 ENCOUNTER — Telehealth: Payer: Self-pay | Admitting: *Deleted

## 2019-05-30 NOTE — Telephone Encounter (Signed)
Fax came for patient stating patient is choosing not to move forward at this time with getting his unit due to cos of unit.

## 2019-06-03 ENCOUNTER — Other Ambulatory Visit (HOSPITAL_COMMUNITY): Payer: Self-pay | Admitting: Adult Health

## 2019-06-04 ENCOUNTER — Ambulatory Visit: Payer: 59 | Attending: Nurse Practitioner | Admitting: Nurse Practitioner

## 2019-06-04 ENCOUNTER — Other Ambulatory Visit: Payer: Self-pay

## 2019-06-04 ENCOUNTER — Encounter: Payer: Self-pay | Admitting: Nurse Practitioner

## 2019-06-04 DIAGNOSIS — I10 Essential (primary) hypertension: Secondary | ICD-10-CM | POA: Diagnosis not present

## 2019-06-04 DIAGNOSIS — I5022 Chronic systolic (congestive) heart failure: Secondary | ICD-10-CM

## 2019-06-06 ENCOUNTER — Telehealth (HOSPITAL_COMMUNITY): Payer: Self-pay

## 2019-06-06 NOTE — Telephone Encounter (Signed)
Finished disability paper work for [employer]. To be signed by Dr. Shirlee Latch.

## 2019-06-07 ENCOUNTER — Encounter: Payer: Self-pay | Admitting: Nurse Practitioner

## 2019-06-07 NOTE — Progress Notes (Signed)
Virtual Visit via Telephone Note Due to national recommendations of social distancing due to COVID 19, telehealth visit is felt to be most appropriate for this patient at this time.  I discussed the limitations, risks, security and privacy concerns of performing an evaluation and management service by telephone and the availability of in person appointments. I also discussed with the patient that there may be a patient responsible charge related to this service. The patient expressed understanding and agreed to proceed.    I connected with Logan Lopez on 06/04/19  at   8:50 AM EST  EDT by telephone and verified that I am speaking with the correct person using two identifiers.   Consent I discussed the limitations, risks, security and privacy concerns of performing an evaluation and management service by telephone and the availability of in person appointments. I also discussed with the patient that there may be a patient responsible charge related to this service. The patient expressed understanding and agreed to proceed.   Location of Patient: Private Residence   Location of Provider: [facility]    Persons participating in Telemedicine visit: [name] FNP-BC YY Noel CMA Logan Lopez    History of Present Illness: Telemedicine visit for: Follow up.  has a past medical history of CHF (congestive heart failure) (HCC), Dyspnea, and Hypertension.   Doing well today. Monitoring weight at home which has been stable. Had recent appt with heart failure clinic on 05-26-2019 and was scheduled for an ECHO. It was also recommended that he have a sleep study and he was referred to a sleep center for this by cardiology. He endorses compliance with his life vest. Carvedilol 3.125 mg BID was started as well on that day. He endorses medication compliance taking amiodarone 200 mg daily, digoxin 0.125 mg Daily, furosemide 40 mg daily, hydralazine 25 mg TID, entresto  24-26 mg BID, imdur 30 mg daily and spirinolactone 25 mg daily. Denies chest pain, shortness of breath with minimal exertion, palpitations, lightheadedness, dizziness, headaches or BLE edema. Blood pressure has been well controlled.  BP Readings from Last 3 Encounters:  05/26/19 130/70  05/06/19 140/80  04/03/19 123/73      Past Medical History:  Diagnosis Date  . CHF (congestive heart failure) (HCC)    new 03/2019  . Dyspnea   . Hypertension     Past Surgical History:  Procedure Laterality Date  . RIGHT/LEFT HEART CATH AND CORONARY ANGIOGRAPHY N/A 03/12/2019   Procedure: RIGHT/LEFT HEART CATH AND CORONARY ANGIOGRAPHY;  Surgeon: Laurey Morale, MD;  Location: [facility] INVASIVE CV LAB;  Service: Cardiovascular;  Laterality: N/A;  . WISDOM TOOTH EXTRACTION      Family History  Problem Relation Age of Onset  . Hypertension Neg Hx     Social History   Socioeconomic History  . Marital status: Married    Spouse name: Not on file  . Number of children: Not on file  . Years of education: Not on file  . Highest education level: Not on file  Occupational History  . Not on file  Tobacco Use  . Smoking status: Former Smoker    Years: 9.00    Types: Cigarettes  . Smokeless tobacco: Never Used  Substance and Sexual Activity  . Alcohol use: Not Currently    Comment: one day a week   . Drug use: Never  . Sexual activity: Not Currently  Other Topics Concern  . Not on file  Social History Narrative  .  Not on file   Social Determinants of Health   Financial Resource Strain:   . Difficulty of Paying Living Expenses: Not on file  Food Insecurity:   . Worried About Charity fundraiser in the Last Year: Not on file  . Ran Out of Food in the Last Year: Not on file  Transportation Needs:   . Lack of Transportation (Medical): Not on file  . Lack of Transportation (Non-Medical): Not on file  Physical Activity:   . Days of Exercise per Week: Not on file  . Minutes of Exercise per  Session: Not on file  Stress:   . Feeling of Stress : Not on file  Social Connections:   . Frequency of Communication with Friends and Family: Not on file  . Frequency of Social Gatherings with Friends and Family: Not on file  . Attends Religious Services: Not on file  . Active Member of Clubs or Organizations: Not on file  . Attends Archivist Meetings: Not on file  . Marital Status: Not on file     Observations/Objective: Awake, alert and oriented x 3   ROS  Assessment and Plan: Logan Lopez was seen today for follow-up.  Diagnoses and all orders for this visit:  Chronic systolic heart failure ([place]) Follow up with Cardiology as instructed Awaiting ECHO/Sleep study  Essential hypertension Continue all antihypertensives as prescribed.  Remember to bring in your blood pressure log with you for your follow up appointment.  DASH/Mediterranean Diets are healthier choices for HTN.      Follow Up Instructions Return in about 3 months (around 09/01/2019).     I discussed the assessment and treatment plan with the patient. The patient was provided an opportunity to ask questions and all were answered. The patient agreed with the plan and demonstrated an understanding of the instructions.   The patient was advised to call back or seek an in-person evaluation if the symptoms worsen or if the condition fails to improve as anticipated.  I provided 17 minutes of non-face-to-face time during this encounter including median intraservice time, reviewing previous notes, labs, imaging, medications and explaining diagnosis and management.  Gildardo Pounds, FNP-BC

## 2019-06-09 NOTE — Telephone Encounter (Signed)
Finished paper work signed by Dr. Shirlee Latch and faxed. Fax confirmation received.  Original to be scanned into chart.

## 2019-06-20 ENCOUNTER — Other Ambulatory Visit: Payer: Self-pay

## 2019-06-20 ENCOUNTER — Ambulatory Visit (HOSPITAL_COMMUNITY)
Admission: RE | Admit: 2019-06-20 | Discharge: 2019-06-20 | Disposition: A | Discharge status: Home / Self Care | Payer: 59 | Source: Ambulatory Visit | Attending: Adult Health | Admitting: Adult Health

## 2019-06-20 ENCOUNTER — Telehealth (HOSPITAL_COMMUNITY): Payer: Self-pay | Admitting: *Deleted

## 2019-06-20 ENCOUNTER — Ambulatory Visit (HOSPITAL_BASED_OUTPATIENT_CLINIC_OR_DEPARTMENT_OTHER)
Admission: RE | Admit: 2019-06-20 | Discharge: 2019-06-20 | Disposition: A | Discharge status: Home / Self Care | Payer: 59 | Source: Ambulatory Visit | Attending: Cardiology | Admitting: Cardiology

## 2019-06-20 VITALS — BP 110/62 | HR 60 | Wt 149.6 lb

## 2019-06-20 DIAGNOSIS — I472 Ventricular tachycardia: Secondary | ICD-10-CM | POA: Diagnosis not present

## 2019-06-20 DIAGNOSIS — I5022 Chronic systolic (congestive) heart failure: Secondary | ICD-10-CM

## 2019-06-20 DIAGNOSIS — I428 Other cardiomyopathies: Secondary | ICD-10-CM | POA: Diagnosis not present

## 2019-06-20 DIAGNOSIS — Z7901 Long term (current) use of anticoagulants: Secondary | ICD-10-CM | POA: Diagnosis not present

## 2019-06-20 DIAGNOSIS — I509 Heart failure, unspecified: Secondary | ICD-10-CM | POA: Diagnosis not present

## 2019-06-20 DIAGNOSIS — I110 Hypertensive heart disease with heart failure: Secondary | ICD-10-CM | POA: Insufficient documentation

## 2019-06-20 DIAGNOSIS — Z79899 Other long term (current) drug therapy: Secondary | ICD-10-CM | POA: Insufficient documentation

## 2019-06-20 DIAGNOSIS — R7989 Other specified abnormal findings of blood chemistry: Secondary | ICD-10-CM

## 2019-06-20 DIAGNOSIS — I493 Ventricular premature depolarization: Secondary | ICD-10-CM | POA: Insufficient documentation

## 2019-06-20 DIAGNOSIS — D509 Iron deficiency anemia, unspecified: Secondary | ICD-10-CM | POA: Insufficient documentation

## 2019-06-20 DIAGNOSIS — R5383 Other fatigue: Secondary | ICD-10-CM | POA: Diagnosis not present

## 2019-06-20 LAB — COMPREHENSIVE METABOLIC PANEL
ALT: 56 U/L — ABNORMAL HIGH (ref 0–44)
AST: 48 U/L — ABNORMAL HIGH (ref 15–41)
Albumin: 3.7 g/dL (ref 3.5–5.0)
Alkaline Phosphatase: 103 U/L (ref 38–126)
Anion gap: 7 (ref 5–15)
BUN: 8 mg/dL (ref 8–23)
CO2: 28 mmol/L (ref 22–32)
Calcium: 9.3 mg/dL (ref 8.9–10.3)
Chloride: 104 mmol/L (ref 98–111)
Creatinine, Ser: 1.07 mg/dL (ref 0.61–1.24)
GFR calc Af Amer: >60 mL/min (ref >60)
GFR calc non Af Amer: >60 mL/min (ref >60)
Glucose, Bld: 126 mg/dL — ABNORMAL HIGH (ref 70–99)
Potassium: 4.5 mmol/L (ref 3.5–5.1)
Sodium: 139 mmol/L (ref 135–145)
Total Bilirubin: 0.4 mg/dL (ref 0.3–1.2)
Total Protein: 7.4 g/dL (ref 6.5–8.1)

## 2019-06-20 LAB — DIGOXIN LEVEL: Digoxin Level: 0.9 ng/mL (ref 0.8–2.0)

## 2019-06-20 MED ORDER — CARVEDILOL 6.25 MG PO TABS: 3.1250 mg | ORAL_TABLET | Freq: Two times a day (BID) | ORAL | 3 refills | Status: DC

## 2019-06-20 MED ORDER — CARVEDILOL 6.25 MG PO TABS: 6.2500 mg | ORAL_TABLET | Freq: Two times a day (BID) | ORAL | 3 refills | Status: DC

## 2019-06-20 NOTE — Patient Instructions (Signed)
INCREASE Coreg to 6.25mg  (1 tab) twice a day   Labs today We will only contact you if something comes back abnormal or we need to make some changes. Otherwise no news is good news!   You have been provided a letter to be out of work for 6 weeks   You have been referred to the Electrophysiologist for consideration of an ICD.  Your physician has recommended that you have a defibrillator inserted. An implantable cardioverter defibrillator (ICD) is a small device that is placed in your chest or, in rare cases, your abdomen. This device uses electrical pulses or shocks to help control life-threatening, irregular heartbeats that could lead the heart to suddenly stop beating (sudden cardiac arrest). Leads are attached to the ICD that goes into your heart. This is done in the hospital and usually requires an overnight stay. Please see the instruction sheet given to you today for more information.   Your provider has recommended that  you wear a Zio Patch for 3 days.  This monitor will record your heart rhythm for our review.  IF you have any symptoms while wearing the monitor please press the button.  If you have any issues with the patch or you notice a red or orange light on it please call the company at [phone].  Once you remove the patch please mail it back to the company as soon as possible so we can get the results.   You have been ordered for an MRI of your heart. You will get a call to schedule an appointment.   Your physician recommends that you schedule a follow-up appointment in: 3 weeks the pharmacy and 6 weeks with Dr Shirlee Latch   Please call office at [phone] option 2 if you have any questions or concerns.    At the [facility], you and your health needs are our priority. As part of our continuing mission to provide you with exceptional heart care, we have created designated Provider Care Teams. These Care Teams include your primary Cardiologist (physician)  and Advanced Practice Providers (APPs- Physician Assistants and Nurse Practitioners) who all work together to provide you with the care you need, when you need it.   You may see any of the following providers on your designated Care Team at your next follow up: Marland Kitchen Dr Arvilla Meres . Dr Marca Ancona . Tonye Becket, NP . Robbie Lis, PA . Karle Plumber, PharmD   Please be sure to bring in all your medications bottles to every appointment.

## 2019-06-20 NOTE — Telephone Encounter (Signed)
-----   Message from Laurey Morale, MD sent at 06/20/2019  4:13 PM EST ----- LFTs still mildly elevated.  Get RUQ [place] to look at liver, check HCV and HBV labs, decrease amiodarone to 100 mg daily.

## 2019-06-20 NOTE — Progress Notes (Signed)
  Echocardiogram 2D Echocardiogram has been performed.  Logan Lopez 06/20/2019, 10:29 AM

## 2019-06-20 NOTE — Telephone Encounter (Signed)
Spoke w/pt, he is aware, agreeable and verbalizes understanding. Orders placed, advised schedulers would call him to arrange next week.

## 2019-06-21 NOTE — Progress Notes (Signed)
PCP: [facility].  Primary Cardiologist: Dr Aundra Dubin   HPI: Logan Lopez is a 64 y.o. with a history of iron deficient anemia, former smoker (1 pack every 2 days ), chronic systolic heart failure, and PVCs.   Admitted 03/2019 increased dyspnea. ECHO showed reduced EF 25-30%. LHC/RHC completed and showed low cardiac ouput/index and no significant coronary disease. While hospitalized he had high PVC burden so amiodarone was started. Discharged with Lifevest.   Echo was done today and reviewed, EF 30-35%, diffuse hypokinesis, mildly decreased RV systolic function.   No significant exertional dyspnea.  He does fatigue easily.  He remains out of work.  No orthopnea/PND.  No chest pain.  No palpitations. He is still wearing the Lifevest.  No longer smoking.    Labs (1/21): digoxin 0.8, TSH normal, K 4.1, creatinine 0.85, AST 47, ALT 51  ECG (personally reviewed): NSR, lateral/anterolateral TWIs  SH: Stopped drinking in 11/20. Worked at [employer] in the  [employer]. Lives with his wife. Active smoker.     PMH 1. Fe deficiency anemia.  2. Tobacco Abuse 3. Chronic systolic CHF: Nonischemic cardiomyopathy.  - 03/10/19 ECHO EF 25-30%  - RHC/LHC (11/20): mean RA 3, PA 41/21, mean PCWP 17, CI 1.9, PVR 3.75 WU; no significant coronary disease.  - Echo (2/21): EF 30-35%, diffuse hypokinesis, mildly decreased RV systolic function.  4. PVCs: frequent.   5. HTN  ROS: All systems negative except as listed in HPI, PMH and Problem List.  FH:  Family History  Problem Relation Age of Onset  . Hypertension Neg Hx     Current Outpatient Medications  Medication Sig Dispense Refill  . acetaminophen (TYLENOL) 500 MG tablet Take 1,000 mg by mouth every 6 (six) hours as needed for mild pain.    Marland Kitchen amiodarone (PACERONE) 200 MG tablet Take 1 tablet (200 mg total) by mouth daily. 30 tablet 5  . carvedilol (COREG) 6.25 MG tablet Take 1 tablet (6.25 mg total) by mouth 2 (two) times  daily. 60 tablet 3  . digoxin (LANOXIN) 0.125 MG tablet Take 1 tablet (0.125 mg total) by mouth daily. 30 tablet 5  . ferrous sulfate 325 (65 FE) MG tablet Take 325 mg by mouth daily with breakfast.    . furosemide (LASIX) 40 MG tablet Take 1 tablet (40 mg total) by mouth as needed. 30 tablet 0  . hydrALAZINE (APRESOLINE) 25 MG tablet TAKE 1 TABLET(25 MG) BY MOUTH THREE TIMES DAILY 90 tablet 1  . isosorbide mononitrate (IMDUR) 30 MG 24 hr tablet TAKE 1 TABLET(30 MG) BY MOUTH DAILY 30 tablet 1  . sacubitril-valsartan (ENTRESTO) 24-26 MG Take 1 tablet by mouth 2 (two) times daily. 180 tablet 3  . spironolactone (ALDACTONE) 25 MG tablet Take 1 tablet (25 mg total) by mouth daily. 30 tablet 3  . vitamin C (ASCORBIC ACID) 500 MG tablet Take 500 mg by mouth daily.     No current facility-administered medications for this encounter.    Vitals:   06/20/19 1055  BP: 110/62  Pulse: 60  SpO2: 99%  Weight: 67.9 kg (149 lb 9.6 oz)   Wt Readings from Last 3 Encounters:  06/20/19 67.9 kg (149 lb 9.6 oz)  05/26/19 66.1 kg (145 lb 12.8 oz)  05/06/19 64.3 kg (141 lb 12.8 oz)    PHYSICAL EXAM: General: NAD Neck: No JVD, no thyromegaly or thyroid nodule.  Lungs: Clear to auscultation bilaterally with normal respiratory effort. CV: Nondisplaced PMI.  Heart regular S1/S2,  no S3/S4, no murmur.  No peripheral edema.  No carotid bruit.  Normal pedal pulses.  Abdomen: Soft, nontender, no hepatosplenomegaly, no distention.  Skin: Intact without lesions or rashes.  Neurologic: Alert and oriented x 3.  Psych: Normal affect. Extremities: No clubbing or cyanosis.  HEENT: Normal.   ASSESSMENT & PLAN: 1. Chronic Systolic CHF: Nonischemic cardiomyopathy, echo 03/10/19 with EF 25-30%, RV ok. No family history of cardiomyopathy. No significant coronary disease on cath in 11/20. No definite pre-existing viral symptoms. It is possible that this could be a PVC-mediated CMP given frequent PVCs at the time of  diagnosis (or that the cardiomyopathy itself could be driving the PVCs). RHC in 11/20 showed low cardiac index at 1.8. CT chest in 11/20 with no evidence for sarcoid Echo was done today and reviewed, EF remains 25-30%.  NYHA class II symptoms, he is not volume overloaded on exam.  - He does not appear to need a diuretic.  - Increase Coreg to 6.25 mg bid.  - Continue digoxin 0.125 daily, check level today.  - Continue hydralazine  25 mg three times a day + 30 mg imdur daily.  - Continue spiro to 25 mg daily.   - Continue Entresto 24/26 bid. .  - Suppress PVCs/NSVT =>amiodarone, see below. - I will arrange for cardiac MRI to assess for infiltrative disease.  - With persistently low EF and relatively young age, I will refer to EP for ICD consideration.  Narrow QRS, would not benefit from CRT.  2. PVCs/NSVT: Very frequent when initially diagnosed in 11/20. ?Cause of cardiomyopathy versus result of cardiomyopathy. Decreased PVCs now on amiodarone.  - LFTs mildly elevated, decrease amiodarone to 100 mg daily. Repeat LFTs and TSH, he will need a regular eye exam.  - Continue Lifevest until after EP evaluation/ICD placement.   - Zio patch x 3 days to assess current PVC burden.  3. Smoking: No longer smoking  4. Daytime Fatigue: Needs home sleep study .  Followup 3 wks with clinical pharmacist for med titration, followup 6 wks with me.   Logan Lopez 06/21/2019

## 2019-06-25 ENCOUNTER — Telehealth (HOSPITAL_COMMUNITY): Payer: Self-pay | Admitting: Vascular Surgery

## 2019-06-25 NOTE — Telephone Encounter (Signed)
Left pt VM to get labs after ABD ultrasound on Monday 3/1

## 2019-06-26 ENCOUNTER — Encounter (HOSPITAL_COMMUNITY): Payer: 59

## 2019-06-30 ENCOUNTER — Other Ambulatory Visit: Payer: Self-pay

## 2019-06-30 ENCOUNTER — Ambulatory Visit (HOSPITAL_COMMUNITY)
Admission: RE | Admit: 2019-06-30 | Discharge: 2019-06-30 | Disposition: A | Discharge status: Home / Self Care | Payer: 59 | Source: Ambulatory Visit | Attending: Cardiology | Admitting: Cardiology

## 2019-06-30 DIAGNOSIS — R7989 Other specified abnormal findings of blood chemistry: Secondary | ICD-10-CM | POA: Insufficient documentation

## 2019-06-30 LAB — HEPATITIS B SURFACE ANTIBODY,QUALITATIVE: Hep B S Ab: NONREACTIVE

## 2019-06-30 LAB — HEPATITIS C ANTIBODY: HCV Ab: NONREACTIVE

## 2019-07-02 ENCOUNTER — Telehealth (HOSPITAL_COMMUNITY): Payer: Self-pay

## 2019-07-02 NOTE — Telephone Encounter (Signed)
Filled out Continuance of Disability - Attending Physician Statement faxed to [employer] with fax confirmation received.  Original to be scanned into chart.

## 2019-07-03 NOTE — Progress Notes (Signed)
PCP: [facility].  Primary Cardiologist: Dr Aundra Dubin   HPI:  Logan Lopez is a 64 y.o. with a history of iron deficient anemia, former smoker (1 pack every 2 days ), chronic systolic heart failure, and PVCs.   Admitted 03/2019 increased dyspnea. ECHO showed reduced EF 25-30%. LHC/RHC completed and showed low cardiac ouput/index and no significant coronary disease. While hospitalized he had high PVC burden so amiodarone was started. Discharged with Lifevest.   Echo was done 06/20/19 and reviewed, EF 30-35%, diffuse hypokinesis, mildly decreased RV systolic function.   Recently presented to HF Clinic on 06/20/19 with Dr. Aundra Dubin. No significant exertional dyspnea.  He reported fatiguing easily.  He remained out of work.  No orthopnea/PND.  No chest pain.  No palpitations. He was still wearing the Lifevest.  No longer smoking.    Today he returns to HF clinic for pharmacist medication titration. At last visit with MD, carvedilol was increased to 6.25 mg BID and amiodarone was decreased to 100 mg daily. Overall he is feeling well today. Here with his wife. He does note increased fatigue/sluggishness lately but has recently started exercising. No dizziness, lightheadedness, chest pain or palpitations. No syncope/presyncope. No SOB/DOE. Has started walking 35-40 minutes daily to increase his activity level. His weight has been stable at home, around 150 lbs. He takes furosemide only as needed (typically ~1x/month). No LEE, PND or orthopnea. His appetite is good. He does not follow a low salt diet. Taking all medications as prescribed.     HF Medications: Carvedilol 6.25 mg BID Entresto 24/26 mg BID Spironolactone 25 mg daily Hydralazine 25 mg TID Isosorbide mononitrate 30 mg daily Digoxin 0.125 mg daily Furosemide 40 mg PRN  Has the patient been experiencing any side effects to the medications prescribed?  no  Does the patient have any problems obtaining medications due to  transportation or finances?   No - has Pharmacist, community.  Understanding of regimen: fair Understanding of indications: fair Potential of compliance: good Patient understands to avoid NSAIDs. Patient understands to avoid decongestants.    Pertinent Lab Values (06/20/19): Marland Kitchen Serum creatinine 1.07, BUN 8, Potassium 4.5, Sodium 139, Digoxin 0.9 ng/mL (not true trough)  Vital Signs: . Weight: 153.8 lbs (last clinic weight: 149.6 lbs) . Blood pressure: 124/68  . Heart rate: 50   Assessment: 1. Chronic Systolic CHF: Nonischemic cardiomyopathy, echo 03/10/19 with EF 25-30%, RV ok. No family history of cardiomyopathy.No significant coronary disease on cath in 11/20. No definite pre-existing viral symptoms. It is possible that this could be a PVC-mediated CMP given frequent PVCs at the time of diagnosis (or that the cardiomyopathy itself could be driving the PVCs).RHC in 11/20 showed low cardiac index at 1.8. CT chest in 11/20 with no evidence for sarcoidEcho was done 06/20/19, EF remains 25-30%.   -NYHA class II symptoms, he is not volume overloaded on exam.  - He does not appear to need a diuretic. Has furosemide 40 mg PRN.  - Continue carvedilol 6.25 mg BID.  - Continue spironolactone 25 mg daily.   - Increase Entresto to 49/51 mg BID. Repeat BMET in 3 weeks.   - Continue digoxin 0.125 daily. Digoxin level 0.9 ng/mL on 06/20/19. Level was not a trough.  -Continue hydralazine  25 mg three times a day and isosorbide mononitrate 30 mg daily.  - Suppress PVCs/NSVT =>amiodarone, see below. - Needs cardiac MRI to assess for infiltrative disease.  - With persistently low EF and relatively young age, he was referred  to EP for ICD consideration.  Narrow QRS, would not benefit from CRT.  2. PVCs/NSVT: Very frequent when initially diagnosed in 11/20. ?Cause of cardiomyopathy versus result of cardiomyopathy.Decreased PVCs now on amiodarone. - Continue amiodarone 100 mg daily - Continue  Lifevest until after EP evaluation/ICD placement.   - Zio patch x 3 days to assess current PVC burden.  3. Smoking: No longer smoking  4. Daytime Fatigue: Needs home sleep study .   Plan: 1) Medication changes: Based on clinical presentation, vital signs and recent labs will increase Entresto to 49/51 mg BID. 2) Labs: repeat 3 weeks 3) Follow-up: 3 weeks with PA/NP Clinic   Karle Plumber, PharmD, BCPS, BCCP, CPP [facility] Pharmacist [phone]

## 2019-07-08 NOTE — Addendum Note (Signed)
Encounter addended by: Crissie Figures, RN on: 07/08/2019 3:09 PM  Actions taken: Imaging Exam ended

## 2019-07-10 ENCOUNTER — Telehealth (HOSPITAL_COMMUNITY): Payer: Self-pay | Admitting: *Deleted

## 2019-07-10 NOTE — Telephone Encounter (Signed)
Received message from Logan Lopez, pt refused the home sleep study as he said he could not afford the copay.  Message sent to Dr Shirlee Latch, will address at next OV

## 2019-07-15 ENCOUNTER — Ambulatory Visit (HOSPITAL_COMMUNITY)
Admission: RE | Admit: 2019-07-15 | Discharge: 2019-07-15 | Disposition: A | Discharge status: Home / Self Care | Payer: 59 | Source: Ambulatory Visit | Attending: Cardiology | Admitting: Cardiology

## 2019-07-15 ENCOUNTER — Other Ambulatory Visit: Payer: Self-pay

## 2019-07-15 VITALS — BP 124/68 | HR 50 | Wt 153.8 lb

## 2019-07-15 DIAGNOSIS — I5022 Chronic systolic (congestive) heart failure: Secondary | ICD-10-CM | POA: Insufficient documentation

## 2019-07-15 DIAGNOSIS — Z87891 Personal history of nicotine dependence: Secondary | ICD-10-CM | POA: Insufficient documentation

## 2019-07-15 DIAGNOSIS — D509 Iron deficiency anemia, unspecified: Secondary | ICD-10-CM | POA: Diagnosis not present

## 2019-07-15 MED ORDER — SACUBITRIL-VALSARTAN 49-51 MG PO TABS: 1.0000 | ORAL_TABLET | Freq: Two times a day (BID) | ORAL | 3 refills | Status: DC

## 2019-07-15 MED ORDER — AMIODARONE HCL 200 MG PO TABS: 100.0000 mg | ORAL_TABLET | Freq: Every day | ORAL | 11 refills | Status: DC

## 2019-07-15 NOTE — Patient Instructions (Signed)
It was a pleasure seeing you today!  MEDICATIONS: -We are changing your medications today -Increase Entresto to 49/51 mg (1 tablet) twice daily. You may take 2 tablets of the 24/26 mg strength twice daily until you pick up the new strength.  -Call if you have questions about your medications.  NEXT APPOINTMENT: Return to clinic in 3 weeks.  In general, to take care of your heart failure: -Limit your fluid intake to 2 Liters (half-gallon) per day.   -Limit your salt intake to ideally 2-3 grams (2000-3000 mg) per day. -Weigh yourself daily and record, and bring that "weight diary" to your next appointment.  (Weight gain of 2-3 pounds in 1 day typically means fluid weight.) -The medications for your heart are to help your heart and help you live longer.   -Please contact us before stopping any of your heart medications.  Call the clinic at [phone] with questions or to reschedule future appointments.

## 2019-07-23 ENCOUNTER — Telehealth (HOSPITAL_COMMUNITY): Payer: Self-pay

## 2019-07-23 NOTE — Telephone Encounter (Signed)
Called patient to discuss disability paperwork.  Called to ask if patient planning to return to work. Pt is out of work for at least six week per last MD office visit. However, wife noted patient lost job and insurance as of yesterday.  Disability paper work completed and to be faxed once signed by MD. Wife noted he was supposed to have visit with EP however have not received a call.  I called EP and spoke with [facility] the scheduler, she will call patient to arrange an appointment.

## 2019-07-23 NOTE — Telephone Encounter (Signed)
Pt was also ordered for an MRI however was not completed. Wife now state that patient does not have insurance. Forwarded to MD as Lorain Childes.

## 2019-07-23 NOTE — Telephone Encounter (Signed)
Disability paperwork faxed to [facility]. Original to be scanned into chart.

## 2019-07-25 ENCOUNTER — Other Ambulatory Visit (HOSPITAL_COMMUNITY): Payer: Self-pay | Admitting: Adult Health

## 2019-07-28 ENCOUNTER — Other Ambulatory Visit (HOSPITAL_COMMUNITY): Payer: Self-pay

## 2019-07-28 MED ORDER — HYDRALAZINE HCL 25 MG PO TABS: ORAL_TABLET | ORAL | 5 refills | Status: DC

## 2019-07-30 ENCOUNTER — Telehealth: Payer: Self-pay | Admitting: Cardiology

## 2019-07-30 NOTE — Telephone Encounter (Signed)
Wife reports pt does not have a disability.  She wants to make sure that she and pt both have full understanding of possible ICD implant. Aware that d/t current Covid office protocol restrictions she cannot accompany him to the room for visit.  Made aware that husband can call her on the phone, have her on speaker, so that she can be part of the visit w/o being present in the room.  Wife verbalized understanding and agreeable to plan.

## 2019-07-30 NOTE — Telephone Encounter (Signed)
New Message   Patients wife is calling because she would like to come to the appt with patient on 4/5. Please advise

## 2019-08-04 ENCOUNTER — Ambulatory Visit (INDEPENDENT_AMBULATORY_CARE_PROVIDER_SITE_OTHER): Payer: 59 | Admitting: Cardiology

## 2019-08-04 ENCOUNTER — Other Ambulatory Visit: Payer: Self-pay

## 2019-08-04 ENCOUNTER — Encounter: Payer: Self-pay | Admitting: Cardiology

## 2019-08-04 VITALS — BP 122/68 | HR 77 | Ht 72.0 in | Wt 153.4 lb

## 2019-08-04 DIAGNOSIS — I428 Other cardiomyopathies: Secondary | ICD-10-CM

## 2019-08-04 NOTE — Progress Notes (Signed)
Electrophysiology Office Note   Date:  08/04/2019   ID:  Koree, Schopf 1955-06-12, MRN [id number]  PCP:  Gildardo Pounds, NP  Cardiologist:  Aundra Dubin Primary Electrophysiologist:  Radames Mejorado Meredith Leeds, MD    Chief Complaint: CHF   History of Present Illness: Logan Lopez is a 64 y.o. male who is being seen today for the evaluation of CHF at the request of Larey Dresser, MD. Presenting today for electrophysiology evaluation.  He has a history of iron deficiency anemia, former tobacco abuse, PVCs, and chronic systolic heart failure due to nonischemic cardiomyopathy.  He presented to the hospital November 2020 with dyspnea.  Echo showed an ejection fraction of 25 to 30%.  Catheterization showed low cardiac output but no significant coronary artery disease.  While in the hospital he had a high PVC burden and amiodarone was started.  He was discharged with a LifeVest.  Today, he denies symptoms of palpitations, chest pain, shortness of breath, orthopnea, PND, lower extremity edema, claudication, dizziness, presyncope, syncope, bleeding, or neurologic sequela. The patient is tolerating medications without difficulties.    Past Medical History:  Diagnosis Date  . CHF (congestive heart failure) ([place])    new 03/2019  . Dyspnea   . Hypertension    Past Surgical History:  Procedure Laterality Date  . RIGHT/LEFT HEART CATH AND CORONARY ANGIOGRAPHY N/A 03/12/2019   Procedure: RIGHT/LEFT HEART CATH AND CORONARY ANGIOGRAPHY;  Surgeon: Larey Dresser, MD;  Location: [facility];  Service: Cardiovascular;  Laterality: N/A;  . WISDOM TOOTH EXTRACTION       Current Outpatient Medications  Medication Sig Dispense Refill  . acetaminophen (TYLENOL) 500 MG tablet Take 1,000 mg by mouth every 6 (six) hours as needed for mild pain.    Marland Kitchen amiodarone (PACERONE) 200 MG tablet Take 0.5 tablets (100 mg total) by mouth daily. 15 tablet 11  . carvedilol (COREG) 6.25 MG tablet Take 1 tablet  (6.25 mg total) by mouth 2 (two) times daily. 60 tablet 3  . digoxin (LANOXIN) 0.125 MG tablet Take 1 tablet (0.125 mg total) by mouth daily. 30 tablet 5  . ferrous sulfate 325 (65 FE) MG tablet Take 325 mg by mouth daily with breakfast.    . furosemide (LASIX) 40 MG tablet Take 1 tablet (40 mg total) by mouth as needed. 30 tablet 0  . hydrALAZINE (APRESOLINE) 25 MG tablet TAKE 1 TABLET(25 MG) BY MOUTH THREE TIMES DAILY 90 tablet 5  . isosorbide mononitrate (IMDUR) 30 MG 24 hr tablet TAKE 1 TABLET(30 MG) BY MOUTH DAILY 30 tablet 1  . sacubitril-valsartan (ENTRESTO) 49-51 MG Take 1 tablet by mouth 2 (two) times daily. 180 tablet 3  . spironolactone (ALDACTONE) 25 MG tablet TAKE 1 TABLET(25 MG) BY MOUTH DAILY 30 tablet 3  . vitamin C (ASCORBIC ACID) 500 MG tablet Take 500 mg by mouth daily.     No current facility-administered medications for this visit.    Allergies:   Penicillins   Social History:  The patient  reports that he has quit smoking. His smoking use included cigarettes. He quit after 9.00 years of use. He has never used smokeless tobacco. He reports previous alcohol use. He reports that he does not use drugs.   Family History:   No family history of heart disease.  Brothers are all healthy.  Otherwise he is unaware of medical issues in his family.   ROS:  Please see the history of present illness.   Otherwise,  review of systems is positive for none.   All other systems are reviewed and negative.    PHYSICAL EXAM: VS:  BP 122/68   Pulse 77   Ht 6' (1.829 m)   Wt 153 lb 6.4 oz (69.6 kg)   SpO2 97%   BMI 20.80 kg/m  , BMI Body mass index is 20.8 kg/m. GEN: Well nourished, well developed, in no acute distress  HEENT: normal  Neck: no JVD, carotid bruits, or masses Cardiac: RRR; no murmurs, rubs, or gallops,no edema  Respiratory:  clear to auscultation bilaterally, normal work of breathing GI: soft, nontender, nondistended, + BS MS: no deformity or atrophy  Skin: warm and  dry Neuro:  Strength and sensation are intact Psych: euthymic mood, full affect  EKG:  EKG is not ordered today. Personal review of the ekg ordered 06/20/19 shows sinus rhythm  Recent Labs: 03/10/2019: B Natriuretic Peptide 1,080.2 03/12/2019: Hemoglobin 14.3; Hemoglobin 14.3; Platelets 171 03/20/2019: Magnesium 2.1 05/06/2019: TSH 0.845 06/20/2019: ALT 56; BUN 8; Creatinine, Ser 1.07; Potassium 4.5; Sodium 139    Lipid Panel     Component Value Date/Time   CHOL 134 03/12/2019 0340   TRIG 81 03/12/2019 0340   HDL 28 (L) 03/12/2019 0340   CHOLHDL 4.8 03/12/2019 0340   VLDL 16 03/12/2019 0340   LDLCALC 90 03/12/2019 0340     Wt Readings from Last 3 Encounters:  08/04/19 153 lb 6.4 oz (69.6 kg)  07/15/19 153 lb 12.8 oz (69.8 kg)  06/20/19 149 lb 9.6 oz (67.9 kg)      Other studies Reviewed: Additional studies/ records that were reviewed today include: TTE 06/20/19  Review of the above records today demonstrates:  1. Left ventricular ejection fraction, by estimation, is 30 to 35%. The  left ventricle has moderately decreased function. The left ventricle  demonstrates global hypokinesis. Left ventricular diastolic parameters are  consistent with Grade I diastolic  dysfunction (impaired relaxation).  2. Right ventricular systolic function is mildly reduced. The right  ventricular size is normal. There is normal pulmonary artery systolic  pressure. The estimated right ventricular systolic pressure is 19.8 mmHg.  3. The mitral valve is normal in structure and function. Trivial mitral  valve regurgitation. No evidence of mitral stenosis.  4. The aortic valve is tricuspid. Aortic valve regurgitation is trivial.  No aortic stenosis is present.  5. The inferior vena cava is normal in size with greater than 50%  respiratory variability, suggesting right atrial pressure of 3 mmHg.   RHC/LHC 03/12/19 1. Mild elevation in PCWP, normal RA pressure.  2. Mild primarily pulmonary  venous hypertension.  3. Low cardiac output, CI 1.8.  4. No significant coronary disease, nonischemic cardiomyopathy.   Monitor 07/13/19 personally reviewed NSR.  Relatively normal study with no significant arrhythmia noted.   ASSESSMENT AND PLAN:  1.  Chronic systolic heart failure due to nonischemic cardiomyopathy: Ejection fraction 30 to 35%.  Patient is now on optimal medical therapy with Entresto, hydralazine, Aldactone, Coreg.  With persistently low ejection fraction, Jemel Ono plan for ICD implant.  Risks and benefits were discussed with patient bleeding, tamponade, infection, pneumothorax.  The patient understands these risks and is agreed to the procedure.  We Juliza Machnik plan for ICD implant post MRI  2: PVCs: PVC burden is now decreased on amiodarone.  Zio patch shows low burden of PVCs.  No changes.  Case discussed with primary cardiology  Current medicines are reviewed at length with the patient today.   The patient does  not have concerns regarding his medicines.  The following changes were made today:  none  Labs/ tests ordered today include:  No orders of the defined types were placed in this encounter.    Disposition:   FU with Gerilynn Mccullars 3 months  Signed, Maegan Buller Jorja Loa, MD  08/04/2019 3:32 PM     [facility] [address] [place] [address] [phone] (office) [phone] (fax)

## 2019-08-04 NOTE — Patient Instructions (Signed)
Medication Instructions:  Your physician recommends that you continue on your current medications as directed. Please refer to the Current Medication list given to you today.  *If you need a refill on your cardiac medications before your next appointment, please call your pharmacy*   Lab Work: Pre procedure lab work: to be determined if procedure is scheduled If you have labs (blood work) drawn today and your tests are completely normal, you will receive your results only by: Marland Kitchen MyChart Message (if you have MyChart) OR . A paper copy in the mail If you have any lab test that is abnormal or we need to change your treatment, we will call you to review the results.   Testing/Procedures: Your physician has recommended that you have a defibrillator inserted. An implantable cardioverter defibrillator (ICD) is a small device that is placed in your chest or, in rare cases, your abdomen. This device uses electrical pulses or shocks to help control life-threatening, irregular heartbeats that could lead the heart to suddenly stop beating (sudden cardiac arrest). Leads are attached to the ICD that goes into your heart. This is done in the hospital and usually requires an overnight stay. Please see the instructions below under other instructions.   Follow-Up: Dr. Elberta Fortis is going to discuss case with Dr. Shirlee Latch further.  You may have MRI testing to determine if ICD still a needed procedure.     Your physician recommends that you schedule a follow-up appointment in: 10-14 days with device clinic for wound check. The office will call to arrange this appointment.  At [facility], you and your health needs are our priority.  As part of our continuing mission to provide you with exceptional heart care, we have created designated Provider Care Teams.  These Care Teams include your primary Cardiologist (physician) and Advanced Practice Providers (APPs -  Physician Assistants and Nurse Practitioners) who all  work together to provide you with the care you need, when you need it.  We recommend signing up for the patient portal called "MyChart".  Sign up information is provided on this After Visit Summary.  MyChart is used to connect with patients for Virtual Visits (Telemedicine).  Patients are able to view lab/test results, encounter notes, upcoming appointments, etc.  Non-urgent messages can be sent to your provider as well.   To learn more about what you can do with MyChart, go to [email or web address].    Your next appointment:   3 month(s) [after your defibrillator implant)   The format for your next appointment:   In Person  Provider:   Loman Brooklyn, MD   Thank you for choosing [facility]!!   Dory Horn, RN [phone]    Other Instructions    Implantable Device Instructions  You are scheduled for: Implantable cardiac defibrillator on ____________ with Dr. Elberta Fortis.  1.   Pre procedure testing-             A.  LAB WORK--- On _____________.   You do not need to be fasting.               B. COVID TEST-- On _____________ at ___________________ Bonita Quin will go to [facility] hospital ([address] [place]) for your Covid testing.   This is a drive thru test site.  There will be multiple testing areas.  Be sure to share with the first checkpoint that you are there for pre-procedure/surgery testing. This will put you into the right (yellow) lane that leads to the PAT  testing team.   Stay in your car and the nurse team will come to your car to test you.  After you are tested please go home and self quarantine until the day of your procedure.    2. On the day of your procedure __________________________ you will go to [facility] hospital [phone] at _________________.  You will go to the main entrance [address] [employer]) and enter where the [employer] parking staff are.  You will check in at ADMITTING.  You may have one support person come in to the hospital with you.   They will be asked to wait in the waiting room.   3.   Do not eat or drink after midnight prior to your procedure.   4.   On the morning of your procedure do NOT take any medication.  5.  The night before your procedure and the morning of your procedure scrub your neck/chest with surgical scrub.  An instruction letter is included with this letter.    5.  Plan for an overnight stay.  If you use your phone frequently bring your phone charger.  When you are discharged you will need someone to drive you home.   6.  You will follow up with the [facility] Device clinic 10-14 days after your procedure. You will follow up with Dr. Elberta Fortis 91 days after your procedure.  These appointments will be made for you.   * If you have ANY questions after you get home, please call the office [phone] and ask for Kelle Ruppert RN or send a MyChart message.     - Preparing For Surgery  Before surgery, you can play an important role. Because skin is not sterile, your skin needs to be as free of germs as possible. You can reduce the number of germs on your skin by washing with CHG (chlorahexidine gluconate) Soap before surgery.  CHG is an antiseptic cleaner which kills germs and bonds with the skin to continue killing germs even after washing.   Please do not use if you have an allergy to CHG or antibacterial soaps.  If your skin becomes reddened/irritated stop using the CHG.   Do not shave (including legs and underarms) for at least 48 hours prior to first CHG shower.  It is OK to shave your face.  Please follow these instructions carefully:  1.  Shower the night before surgery and the morning of surgery with CHG.  2.  If you choose to wash your hair, wash your hair first as usual with your normal shampoo.  3.  After you shampoo, rinse your hair and body thoroughly to remove the shampoo.  4.  Use CHG as you would any other liquid soap.  You can apply CHG directly to the skin and wash gently with a  clean washcloth. 5.  Apply the CHG Soap to your body ONLY FROM THE NECK DOWN.  Do not use on open wounds or open sores.  Avoid contact with your eyes, ears, mouth and genitals (private parts).  Wash genitals (private parts) with your normal soap.  6.  Wash thoroughly, paying special attention to the area where your surgery will be performed.  7.  Thoroughly rinse your body with warm water from the neck down.   8.  DO NOT shower/wash with your normal soap after using and rinsing off the CHG soap.  9.  Pat yourself dry with a clean towel.  10.  Wear clean pajamas.           11.  Place clean sheets on your bed the night of your first shower and do not sleep with pets.  Day of Surgery: Do not apply any deodorants/lotions.  Please wear clean clothes to the hospital/surgery center.    Cardioverter Defibrillator Implantation An implantable cardioverter defibrillator (ICD) is a small, lightweight, battery-powered device that is placed (implanted) under the skin in the chest or abdomen. Your caregiver may prescribe an ICD if:  You have had an irregular heart rhythm (arrhythmia) that originated in the lower chambers of the heart (ventricles).  Your heart has been damaged by a disease (such as coronary artery disease) or heart condition (such as a heart attack). An ICD consists of a battery that lasts several years, a small computer called a pulse generator, and wires called leads that go into the heart. It is used to detect and correct two dangerous arrhythmias: a rapid heart rhythm (tachycardia) and an arrhythmia in which the ventricles contract in an uncoordinated way (fibrillation). When an ICD detects tachycardia, it sends an electrical signal to the heart that restores the heartbeat to normal (cardioversion). This signal is usually painless. If cardioversion does not work or if the ICD detects fibrillation, it delivers a small electrical shock to the heart (defibrillation) to restart the  heart. The shock may feel like a strong jolt in the chest. ICDs may be programmed to correct other problems. Sometimes, ICDs are programmed to act as another type of implantable device called a pacemaker. Pacemakers are used to treat a slow heartbeat (bradycardia). LET YOUR CAREGIVER KNOW ABOUT:  Any allergies you have.  All medicines you are taking, including vitamins, herbs, eyedrops, and over-the-counter medicines and creams.  Previous problems you or members of your family have had with the use of anesthetics.  Any blood disorders you have had.  Other health problems you have. RISKS AND COMPLICATIONS Generally, the procedure to implant an ICD is safe. However, as with any surgical procedure, complications can occur. Possible complications associated with implanting an ICD include:  Swelling, bleeding, or bruising at the site where the ICD was implanted.  Infection at the site where the ICD was implanted.  A reaction to medicine used during the procedure.  Nerve, heart, or blood vessel damage.  Blood clots. BEFORE THE PROCEDURE  You may need to have blood tests, heart tests, or a chest X-ray done before the day of the procedure.  Ask your caregiver about changing or stopping your regular medicines.  Make plans to have someone drive you home. You may need to stay in the hospital overnight after the procedure.  Stop smoking at least 24 hours before the procedure.  Take a bath or shower the night before the procedure. You may need to scrub your chest or abdomen with a special type of soap.  Do not eat or drink before your procedure for as long as directed by your caregiver. Ask if it is okay to take any needed medicine with a small sip of water. PROCEDURE  The procedure to implant an ICD in your chest or abdomen is usually done at a hospital in a room that has a large X-ray machine called a fluoroscope. The machine will be above you during the procedure. It will help your  caregiver see your heart during the procedure. Implanting an ICD usually takes 1-3 hours. Before the procedure:   Small monitors will be put on your body.  They will be used to check your heart, blood pressure, and oxygen level.  A needle will be put into a vein in your hand or arm. This is called an intravenous (IV) access tube. Fluids and medicine will flow directly into your body through the IV tube.  Your chest or abdomen will be cleaned with a germ-killing (antiseptic) solution. The area may be shaved.  You may be given medicine to help you relax (sedative).  You will be given a medicine called a local anesthetic. This medicine will make the surgical site numb while the ICD is implanted. You will be sleepy but awake during the procedure. After you are numb the procedure will begin. The caregiver will:  Make a small cut (incision). This will make a pocket deep under your skin that will hold the pulse generator.  Guide the leads through a large blood vessel into your heart and attach them to the heart muscles. Depending on the ICD, the leads may go into one ventricle or they may go to both ventricles and into an upper chamber of the heart (atrium).  Test the ICD.  Close the incision with stitches, glue, or staples. AFTER THE PROCEDURE  You may feel pain. Some pain is normal. It may last a few days.  You may stay in a recovery area until the local anesthetic has worn off. Your blood pressure and pulse will be checked often. You will be taken to a room where your heart will be monitored.  A chest X-ray will be taken. This is done to check that the cardioverter defibrillator is in the right place.  You may stay in the hospital overnight.  A slight bump may be seen over the skin where the ICD was placed. Sometimes, it is possible to feel the ICD under the skin. This is normal.  In the months and years afterward, your caregiver will check the device, the leads, and the battery every few  months. Eventually, when the battery is low, the ICD will be replaced.   This information is not intended to replace advice given to you by your health care provider. Make sure you discuss any questions you have with your health care provider.   Document Released: 01/07/2002 Document Revised: 02/05/2013 Document Reviewed: 05/06/2012 Elsevier Interactive Patient Education 2016 [place] Defibrillator Implantation, Care After This sheet gives you information about how to care for yourself after your procedure. Your health care provider may also give you more specific instructions. If you have problems or questions, contact your health care provider. What can I expect after the procedure? After the procedure, it is common to have:  Some pain. It may last a few days.  A slight bump over the skin where the device was placed. Sometimes, it is possible to feel the device under the skin. This is normal.  During the months and years after your procedure, your health care provider will check the device, the leads, and the battery every few months. Eventually, when the battery is low, the device will be replaced. Follow these instructions at home: Medicines  Take over-the-counter and prescription medicines only as told by your health care provider.  If you were prescribed an antibiotic medicine, take it as told by your health care provider. Do not stop taking the antibiotic even if you start to feel better. Incision care   Follow instructions from your health care provider about how to take care of your incision area. Make sure you: ?  Wash your hands with soap and water before you change your bandage (dressing). If soap and water are not available, use hand sanitizer. ? Change your dressing as told by your health care provider. ? Leave stitches (sutures), skin glue, or adhesive strips in place. These skin closures may need to stay in place for 2 weeks or longer. If adhesive  strip edges start to loosen and curl up, you may trim the loose edges. Do not remove adhesive strips completely unless your health care provider tells you to do that.  Check your incision area every day for signs of infection. Check for: ? More redness, swelling, or pain. ? More fluid or blood. ? Warmth. ? Pus or a bad smell.  Do not use lotions or ointments near the incision area unless told by your health care provider.  Keep the incision area clean and dry for 2-3 days after the procedure or for as long as told by your health care provider. It takes several weeks for the incision site to heal completely.  Do not take baths, swim, or use a hot tub until your health care provider approves. Activity  Try to walk a little every day. Exercising is important after this procedure. Also, use your shoulder on the side of the defibrillator in daily tasks that do not require a lot of motion.  For at least 6 weeks: ? Do not lift your upper arm above your shoulders. This means no tennis, golf, or swimming for this period of time. If you tend to sleep with your arm above your head, use a restraint to prevent this during sleep. ? Avoid sudden jerking, pulling, or chopping movements that pull your upper arm far away from your body.  Ask your health care provider when you may go back to work.  Check with your health care provider before you start to drive or play sports. Electric and magnetic fields  Tell all health care providers that you have a defibrillator. This may prevent them from giving you an MRI scan because strong magnets are used for that test.  If you must pass through a metal detector, quickly walk through it. Do not stop under the detector, and do not stand near it.  Avoid places or objects that have a strong electric or magnetic field, including: ? [employer]. At the airport, let officials know that you have a defibrillator. Your defibrillator ID card will let you be  checked in a way that is safe for you and will not damage your defibrillator. Also, do not let a security person wave a magnetic wand near your defibrillator. That can make it stop working. ? Power plants. ? Large electrical generators. ? Anti-theft systems or electronic article surveillance (EAS). ? Radiofrequency transmission towers, such as cell phone and radio towers.  Do not use amateur (ham) radio equipment or electric (arc) welding torches. Some devices are safe to use if held at least 12 inches (30 cm) from your defibrillator. These include power tools, lawn mowers, and speakers. If you are unsure if something is safe to use, ask your health care provider.  Do not use MP3 player headphones. They have magnets.  You may safely use electric blankets, heating pads, computers, and microwave ovens.  When using your cell phone, hold it to the ear that is on the opposite side from the defibrillator. Do not leave your cell phone in a pocket over the defibrillator. General instructions  Follow diet instructions from your health care provider,  if this applies.  Always keep your defibrillator ID card with you. The card should list the implant date, device model, and manufacturer. Consider wearing a medical alert bracelet or necklace.  Have your defibrillator checked every 3-6 months or as often as told by your health care provider. Most defibrillators last for 4-8 years.  Keep all follow-up visits as told by your health care provider. This is important for your health care provider to make sure your chest is healing the way it should. Ask your health care provider when you should come back to have your stitches or staples taken out. Contact a health care provider if:  You feel one shock in your chest.  You gain weight suddenly.  Your legs or feet swell more than they have before.  It feels like your heart is fluttering or skipping beats (heart palpitations).  You have more redness,  swelling, or pain around your incision.  You have more fluid or blood coming from your incision.  Your incision feels warm to the touch.  You have pus or a bad smell coming from your incision.  You have a fever. Get help right away if:  You have chest pain.  You feel more than one shock.  You feel more short of breath than you have felt before.  You feel more light-headed than you have felt before.  Your incision starts to open up. This information is not intended to replace advice given to you by your health care provider. Make sure you discuss any questions you have with your health care provider. Document Released: 11/04/2004 Document Revised: 11/05/2015 Document Reviewed: 09/22/2015 Elsevier Interactive Patient Education  2018 [employer].     Supplemental Discharge Instructions for  Pacemaker/Defibrillator Patients  ACTIVITY No heavy lifting or vigorous activity with your left/right arm for 6 to 8 weeks.  Do not raise your left/right arm above your head for one week.  Gradually raise your affected arm as drawn below.           __  NO DRIVING for     ; you may begin driving on     .  WOUND CARE - Keep the wound area clean and dry.  Do not get this area wet for one week. No showers for one week; you may shower on     . - The tape/steri-strips on your wound will fall off; do not pull them off.  No bandage is needed on the site.  DO  NOT apply any creams, oils, or ointments to the wound area. - If you notice any drainage or discharge from the wound, any swelling or bruising at the site, or you develop a fever > 101? F after you are discharged home, call the office at once.  SPECIAL INSTRUCTIONS - You are still able to use cellular telephones; use the ear opposite the side where you have your pacemaker/defibrillator.  Avoid carrying your cellular phone near your device. - When traveling through airports, show security personnel your identification card to avoid being  screened in the metal detectors.  Ask the security personnel to use the hand wand. - Avoid arc welding equipment, MRI testing (magnetic resonance imaging), TENS units (transcutaneous nerve stimulators).  Call the office for questions about other devices. - Avoid electrical appliances that are in poor condition or are not properly grounded. - Microwave ovens are safe to be near or to operate.  ADDITIONAL INFORMATION FOR DEFIBRILLATOR PATIENTS SHOULD YOUR DEVICE GO OFF: - If your device  goes off ONCE and you feel fine afterward, notify the device clinic nurses. - If your device goes off ONCE and you do not feel well afterward, call 911. - If your device goes off TWICE, call 911. - If your device goes off THREE TIMES IN ONE DAY, call 911.  DO NOT DRIVE YOURSELF OR A FAMILY MEMBER WITH A DEFIBRILLATOR TO THE HOSPITAL--CALL 911.

## 2019-08-05 ENCOUNTER — Other Ambulatory Visit (HOSPITAL_COMMUNITY): Payer: Self-pay | Admitting: Adult Health

## 2019-08-07 ENCOUNTER — Encounter (HOSPITAL_COMMUNITY): Payer: 59

## 2019-08-11 ENCOUNTER — Encounter (HOSPITAL_COMMUNITY): Payer: Self-pay

## 2019-08-11 ENCOUNTER — Other Ambulatory Visit: Payer: Self-pay

## 2019-08-11 ENCOUNTER — Ambulatory Visit (HOSPITAL_COMMUNITY)
Admission: RE | Admit: 2019-08-11 | Discharge: 2019-08-11 | Disposition: A | Discharge status: Home / Self Care | Payer: 59 | Source: Ambulatory Visit | Attending: Cardiology | Admitting: Cardiology

## 2019-08-11 VITALS — BP 110/68 | HR 50 | Wt 154.2 lb

## 2019-08-11 DIAGNOSIS — I110 Hypertensive heart disease with heart failure: Secondary | ICD-10-CM | POA: Diagnosis not present

## 2019-08-11 DIAGNOSIS — Z8249 Family history of ischemic heart disease and other diseases of the circulatory system: Secondary | ICD-10-CM | POA: Insufficient documentation

## 2019-08-11 DIAGNOSIS — I493 Ventricular premature depolarization: Secondary | ICD-10-CM | POA: Diagnosis not present

## 2019-08-11 DIAGNOSIS — D509 Iron deficiency anemia, unspecified: Secondary | ICD-10-CM | POA: Diagnosis not present

## 2019-08-11 DIAGNOSIS — I5022 Chronic systolic (congestive) heart failure: Secondary | ICD-10-CM | POA: Diagnosis present

## 2019-08-11 DIAGNOSIS — Z79899 Other long term (current) drug therapy: Secondary | ICD-10-CM | POA: Insufficient documentation

## 2019-08-11 DIAGNOSIS — I428 Other cardiomyopathies: Secondary | ICD-10-CM | POA: Insufficient documentation

## 2019-08-11 DIAGNOSIS — Z87891 Personal history of nicotine dependence: Secondary | ICD-10-CM | POA: Diagnosis not present

## 2019-08-11 DIAGNOSIS — I472 Ventricular tachycardia: Secondary | ICD-10-CM | POA: Insufficient documentation

## 2019-08-11 LAB — CBC
HCT: 39.2 % (ref 39.0–52.0)
Hemoglobin: 12.9 g/dL — ABNORMAL LOW (ref 13.0–17.0)
MCH: 32.4 pg (ref 26.0–34.0)
MCHC: 32.9 g/dL (ref 30.0–36.0)
MCV: 98.5 fL (ref 80.0–100.0)
Platelets: 199 10*3/uL (ref 150–400)
RBC: 3.98 MIL/uL — ABNORMAL LOW (ref 4.22–5.81)
RDW: 12.2 % (ref 11.5–15.5)
WBC: 4.3 10*3/uL (ref 4.0–10.5)
nRBC: 0 % (ref 0.0–0.2)

## 2019-08-11 LAB — COMPREHENSIVE METABOLIC PANEL
ALT: 35 U/L (ref 0–44)
AST: 39 U/L (ref 15–41)
Albumin: 3.6 g/dL (ref 3.5–5.0)
Alkaline Phosphatase: 103 U/L (ref 38–126)
Anion gap: 6 (ref 5–15)
BUN: 7 mg/dL — ABNORMAL LOW (ref 8–23)
CO2: 30 mmol/L (ref 22–32)
Calcium: 9.3 mg/dL (ref 8.9–10.3)
Chloride: 104 mmol/L (ref 98–111)
Creatinine, Ser: 1 mg/dL (ref 0.61–1.24)
GFR calc Af Amer: >60 mL/min (ref >60)
GFR calc non Af Amer: >60 mL/min (ref >60)
Glucose, Bld: 91 mg/dL (ref 70–99)
Potassium: 4.4 mmol/L (ref 3.5–5.1)
Sodium: 140 mmol/L (ref 135–145)
Total Bilirubin: 0.5 mg/dL (ref 0.3–1.2)
Total Protein: 7.6 g/dL (ref 6.5–8.1)

## 2019-08-11 LAB — TSH: TSH: 0.621 u[IU]/mL (ref 0.350–4.500)

## 2019-08-11 LAB — T4, FREE: Free T4: 1.12 ng/dL (ref 0.61–1.12)

## 2019-08-11 LAB — DIGOXIN LEVEL: Digoxin Level: 0.7 ng/mL — ABNORMAL LOW (ref 0.8–2.0)

## 2019-08-11 NOTE — Progress Notes (Signed)
PCP: [facility].  Primary Cardiologist: Dr Shirlee Latch   HPI: Logan Lopez is a 64 y.o. with a history of iron deficient anemia, former smoker (1 pack every 2 days ), chronic systolic heart failure, and PVCs.   Admitted 03/2019 increased dyspnea. ECHO showed reduced EF 25-30%. LHC/RHC completed and showed low cardiac ouput/index and no significant coronary disease. While hospitalized he had high PVC burden so amiodarone was started. Discharged with Lifevest. Was placed on GDMT which has been optimized.   Echo was repeated 06/20/19, EF 30-35%, diffuse hypokinesis, mildly decreased RV systolic function. Was referred to EP for ICD consideration. cMRI was also recommended to r/o infiltrative CM.   Had consult w/ Dr. Elberta Fortis 4/5 and ICD recommended after cMRI completed.   Presents back to clinic today for f/u. Here w/ his wife. Doing well symptomatically. NYHA class II. Wt stable. Compliant w/ medications and with LifeVest. VSS. No side effects w/ meds. cMRI not completed yet, as he is concerned about cost. Unable to afford. Also unable to afford recent sleep study that was also recommended to r/o OSA. He would like to get ICD placed.    Labs (1/21): digoxin 0.8, TSH normal, K 4.1, creatinine 0.85, AST 47, ALT 51 Labs (2/21): digoxin 0.9, Scr 1.07, K 4.5    SH: Stopped drinking in 11/20. Worked at [employer] in the  [employer]. Lives with his wife. Active smoker.     PMH 1. Fe deficiency anemia.  2. Tobacco Abuse 3. Chronic systolic CHF: Nonischemic cardiomyopathy.  - 03/10/19 ECHO EF 25-30%  - RHC/LHC (11/20): mean RA 3, PA 41/21, mean PCWP 17, CI 1.9, PVR 3.75 WU; no significant coronary disease.  - Echo (2/21): EF 30-35%, diffuse hypokinesis, mildly decreased RV systolic function.  4. PVCs: frequent.   5. HTN  ROS: All systems negative except as listed in HPI, PMH and Problem List.  FH:  Family History  Problem Relation Age of Onset  . Hypertension Neg Hx      Current Outpatient Medications  Medication Sig Dispense Refill  . acetaminophen (TYLENOL) 500 MG tablet Take 1,000 mg by mouth every 6 (six) hours as needed for mild pain.    Marland Kitchen amiodarone (PACERONE) 200 MG tablet Take 0.5 tablets (100 mg total) by mouth daily. 15 tablet 11  . carvedilol (COREG) 6.25 MG tablet Take 1 tablet (6.25 mg total) by mouth 2 (two) times daily. 60 tablet 3  . digoxin (LANOXIN) 0.125 MG tablet Take 1 tablet (0.125 mg total) by mouth daily. 30 tablet 5  . ferrous sulfate 325 (65 FE) MG tablet Take 325 mg by mouth daily with breakfast.    . furosemide (LASIX) 40 MG tablet Take 1 tablet (40 mg total) by mouth as needed. 30 tablet 0  . hydrALAZINE (APRESOLINE) 25 MG tablet TAKE 1 TABLET(25 MG) BY MOUTH THREE TIMES DAILY 90 tablet 5  . isosorbide mononitrate (IMDUR) 30 MG 24 hr tablet TAKE 1 TABLET(30 MG) BY MOUTH DAILY 30 tablet 1  . sacubitril-valsartan (ENTRESTO) 49-51 MG Take 1 tablet by mouth 2 (two) times daily. 180 tablet 3  . spironolactone (ALDACTONE) 25 MG tablet TAKE 1 TABLET(25 MG) BY MOUTH DAILY 30 tablet 3  . vitamin C (ASCORBIC ACID) 500 MG tablet Take 500 mg by mouth daily.     No current facility-administered medications for this encounter.    Vitals:   08/11/19 1501  BP: 110/68  Pulse: (!) 50  SpO2: 98%  Weight: 69.9 kg (154  lb 3.2 oz)   Wt Readings from Last 3 Encounters:  08/11/19 69.9 kg (154 lb 3.2 oz)  08/04/19 69.6 kg (153 lb 6.4 oz)  07/15/19 69.8 kg (153 lb 12.8 oz)    PHYSICAL EXAM: General:  Well appearing, thin AAM. No respiratory difficulty, wearing LifeVest HEENT: normal Neck: supple. no JVD. Carotids 2+ bilat; no bruits. No lymphadenopathy or thyromegaly appreciated. Cor: PMI nondisplaced. Regular rate & rhythm. No rubs, gallops or murmurs. Lungs: clear Abdomen: soft, nontender, nondistended. No hepatosplenomegaly. No bruits or masses. Good bowel sounds. Extremities: no cyanosis, clubbing, rash, edema Neuro: alert &  oriented x 3, cranial nerves grossly intact. moves all 4 extremities w/o difficulty. Affect pleasant.   ASSESSMENT & PLAN: 1. Chronic Systolic CHF: Nonischemic cardiomyopathy, echo 03/10/19 with EF 25-30%, RV ok. No family history of cardiomyopathy. No significant coronary disease on cath in 11/20. No definite pre-existing viral symptoms. It is possible that this could be a PVC-mediated CMP given frequent PVCs at the time of diagnosis (or that the cardiomyopathy itself could be driving the PVCs). RHC in 11/20 showed low cardiac index at 1.8. CT chest in 11/20 with no evidence for sarcoid. Echo repeated 2/21, EF remains 25-30%.  - He has been referred to EP and ICD recommended. This has not been scheduled yet. Plan was to wait until cMRI was completed but pt unable to afford study. He wishes to proceed w/ ICD placement. Notification sent to Dr. Curt Bears and EP clinic nursing staff. They will schedule time for implant. In meantime, he will continue w/ Lifevest.    - NYHA class II symptoms. Euvolemic on exam  - He does not appear to need a diuretic.  - Continue Coreg  6.25 mg bid. HR limits further titration  - Continue digoxin 0.125 daily, check dig level today.  - Continue hydralazine  25 mg three times a day + 30 mg imdur daily.  - Continue spiro to 25 mg daily.   - Continue Entresto 24/26 bid. BP too soft for titration  - Suppress PVCs/NSVT =>amiodarone, see below. - as noted above cMRI to assess for infiltrative disease declined due to cost  - Plan ICD implant per EP. Narrow QRS, would not benefit from CRT. Per Pt report, target schedule date will be June.  2. PVCs/NSVT: Very frequent when initially diagnosed in 11/20. ?Cause of cardiomyopathy versus result of cardiomyopathy. Decreased PVCs now on amiodarone, as confirmed by Zip patch 2/21 -Continue amiodarone 100 mg daily  -Check HFTs and TFTs today  - He will need a regular eye exam.  - Continue Lifevest until after ICD placement.   3.  Smoking: No longer smoking  4. Daytime Fatigue: Needs home sleep study but pt declined due to cost .  F/u w/ Dr. Aundra Dubin in 3 months   Logan Jester, PA-C 08/11/2019

## 2019-08-11 NOTE — Patient Instructions (Addendum)
No medication changes today  Labs today We will only contact you if something comes back abnormal or we need to make some changes. Otherwise no news is good news!  Your physician recommends that you schedule a follow-up appointment in: 3 months with Dr Mclean  Please call office at [phone] option 2 if you have any questions or concerns.   At the [facility], you and your health needs are our priority. As part of our continuing mission to provide you with exceptional heart care, we have created designated Provider Care Teams. These Care Teams include your primary Cardiologist (physician) and Advanced Practice Providers (APPs- Physician Assistants and Nurse Practitioners) who all work together to provide you with the care you need, when you need it.   You may see any of the following providers on your designated Care Team at your next follow up: . Dr Daniel Bensimhon . Dr Dalton McLean . Amy Clegg, NP . Brittainy Simmons, PA . Lauren Kemp, PharmD   Please be sure to bring in all your medications bottles to every appointment.    

## 2019-08-12 ENCOUNTER — Telehealth (HOSPITAL_COMMUNITY): Payer: Self-pay

## 2019-08-12 LAB — T3, FREE: T3, Free: 2.6 pg/mL (ref 2.0–4.4)

## 2019-08-12 NOTE — Telephone Encounter (Signed)
-----   Message from Allayne Butcher, [place] sent at 08/11/2019  9:23 PM EDT ----- Please let pt know that Dr. Elberta Fortis said ok to Implant ICD despite no cMRI. He should be on the look out for a phone call from EP to schedule implant.

## 2019-08-12 NOTE — Telephone Encounter (Signed)
Pts wife aware of to expect a call to schedule implant despite not getting MRI

## 2019-08-13 ENCOUNTER — Telehealth (HOSPITAL_COMMUNITY): Payer: Self-pay

## 2019-08-13 NOTE — Telephone Encounter (Signed)
Disability forms from [place] [employer] completed and faxed to attn [employer] [phone]. Confirmation received. Original scanned into chart.

## 2019-08-14 ENCOUNTER — Telehealth: Payer: Self-pay | Admitting: Cardiology

## 2019-08-14 NOTE — Telephone Encounter (Signed)
Patient's wife is calling to find out if her husband has been placed yet on the schedule for his defib.

## 2019-08-14 NOTE — Telephone Encounter (Signed)
Informed that I am working on seeing how we can get pt scheduled for May. Aware I will follow up by next week to arrange date for ICD implant. Wife & pt agreeable to plan

## 2019-08-29 ENCOUNTER — Encounter: Payer: Self-pay | Admitting: *Deleted

## 2019-08-29 ENCOUNTER — Telehealth: Payer: Self-pay | Admitting: Cardiology

## 2019-08-29 DIAGNOSIS — Z01812 Encounter for preprocedural laboratory examination: Secondary | ICD-10-CM

## 2019-08-29 DIAGNOSIS — I5022 Chronic systolic (congestive) heart failure: Secondary | ICD-10-CM

## 2019-08-29 NOTE — Telephone Encounter (Signed)
Scheduled virtual telephone visit with Dr. Elberta Fortis for Monday 5/3. ICD implant scheduled for 5/26. Covid screening 5/24. Procedure instructions reviewed. Aware letter of instructions can be picked up from [facility] office Tuesday/after. Wife verbalized understanding and agreeable to plan.

## 2019-08-29 NOTE — Telephone Encounter (Signed)
lmtcb

## 2019-08-29 NOTE — Telephone Encounter (Signed)
New Message    Pts wife is calling and is requesting Sherri to call her back about setting up a procedure    Please call

## 2019-09-01 ENCOUNTER — Ambulatory Visit: Payer: 59 | Admitting: Nurse Practitioner

## 2019-09-01 ENCOUNTER — Telehealth (INDEPENDENT_AMBULATORY_CARE_PROVIDER_SITE_OTHER): Payer: 59 | Admitting: Cardiology

## 2019-09-01 ENCOUNTER — Encounter: Payer: Self-pay | Admitting: Cardiology

## 2019-09-01 VITALS — BP 120/79

## 2019-09-01 DIAGNOSIS — I428 Other cardiomyopathies: Secondary | ICD-10-CM | POA: Diagnosis not present

## 2019-09-01 NOTE — Progress Notes (Signed)
Electrophysiology TeleHealth Note   Due to national recommendations of social distancing due to COVID 19, an audio/video telehealth visit is felt to be most appropriate for this patient at this time.  See Epic message for the patient's consent to telehealth for [facility].   Date:  09/01/2019   ID:  Logan Lopez, DOB 1956-03-03, MRN [id number]  Location: patient's home  Provider location: [address] [place]  Evaluation Performed: Follow-up visit  PCP:  Gildardo Pounds, NP  Cardiologist: Trey Paula Electrophysiologist:  Dr Curt Bears  Chief Complaint: CHF  History of Present Illness:    Logan Lopez is a 64 y.o. male who presents via audio/video conferencing for a telehealth visit today.  Since last being seen in our clinic, the patient reports doing very well.  Today, he denies symptoms of palpitations, chest pain, shortness of breath,  lower extremity edema, dizziness, presyncope, or syncope.  The patient is otherwise without complaint today.  The patient denies symptoms of fevers, chills, cough, or new SOB worrisome for COVID 19.  He has history significant for iron deficiency anemia, tobacco abuse, PVCs, and chronic systolic heart failure due to a nonischemic cardiomyopathy.  Echo November 2020 showed an ejection fraction of 25 to 30%.  He has plans for an ICD 09/24/2019.  Today, denies symptoms of palpitations, chest pain, shortness of breath, orthopnea, PND, lower extremity edema, claudication, dizziness, presyncope, syncope, bleeding, or neurologic sequela. The patient is tolerating medications without difficulties.    Past Medical History:  Diagnosis Date   CHF (congestive heart failure) ([place])    new 03/2019   Dyspnea    Hypertension     Past Surgical History:  Procedure Laterality Date   RIGHT/LEFT HEART CATH AND CORONARY ANGIOGRAPHY N/A 03/12/2019   Procedure: RIGHT/LEFT HEART CATH AND CORONARY ANGIOGRAPHY;  Surgeon: Larey Dresser, MD;   Location: [facility];  Service: Cardiovascular;  Laterality: N/A;   WISDOM TOOTH EXTRACTION      Current Outpatient Medications  Medication Sig Dispense Refill   acetaminophen (TYLENOL) 500 MG tablet Take 1,000 mg by mouth every 6 (six) hours as needed for mild pain.     amiodarone (PACERONE) 200 MG tablet Take 0.5 tablets (100 mg total) by mouth daily. 15 tablet 11   carvedilol (COREG) 6.25 MG tablet Take 1 tablet (6.25 mg total) by mouth 2 (two) times daily. 60 tablet 3   digoxin (LANOXIN) 0.125 MG tablet Take 1 tablet (0.125 mg total) by mouth daily. 30 tablet 5   ferrous sulfate 325 (65 FE) MG tablet Take 325 mg by mouth daily with breakfast.     furosemide (LASIX) 40 MG tablet Take 1 tablet (40 mg total) by mouth as needed. 30 tablet 0   hydrALAZINE (APRESOLINE) 25 MG tablet TAKE 1 TABLET(25 MG) BY MOUTH THREE TIMES DAILY 90 tablet 5   isosorbide mononitrate (IMDUR) 30 MG 24 hr tablet TAKE 1 TABLET(30 MG) BY MOUTH DAILY 30 tablet 1   sacubitril-valsartan (ENTRESTO) 49-51 MG Take 1 tablet by mouth 2 (two) times daily. 180 tablet 3   spironolactone (ALDACTONE) 25 MG tablet TAKE 1 TABLET(25 MG) BY MOUTH DAILY 30 tablet 3   vitamin C (ASCORBIC ACID) 500 MG tablet Take 500 mg by mouth daily.     No current facility-administered medications for this visit.    Allergies:   Penicillins   Social History:  The patient  reports that he has quit smoking. His smoking use included cigarettes.  He quit after 9.00 years of use. He has never used smokeless tobacco. He reports previous alcohol use. He reports that he does not use drugs.   Family History:  The patient's  family history includes Hypertension in his mother; Kidney disease in his mother.   ROS:  Please see the history of present illness.   All other systems are personally reviewed and negative.    Exam:    Vital Signs:  BP 120/79   no acute distress, no shortness of breath.  Labs/Other Tests and Data Reviewed:      Recent Labs: 03/10/2019: B Natriuretic Peptide 1,080.2 03/20/2019: Magnesium 2.1 08/11/2019: ALT 35; BUN 7; Creatinine, Ser 1.00; Hemoglobin 12.9; Platelets 199; Potassium 4.4; Sodium 140; TSH 0.621   Wt Readings from Last 3 Encounters:  08/11/19 154 lb 3.2 oz (69.9 kg)  08/04/19 153 lb 6.4 oz (69.6 kg)  07/15/19 153 lb 12.8 oz (69.8 kg)     Other studies personally reviewed: Additional studies/ records that were reviewed today include: ECG 06/20/2019 personally reviewed Review of the above records today demonstrates: Sinus rhythm  ASSESSMENT & PLAN:    1.  Chronic systolic heart failure due to nonischemic cardiomyopathy: Currently on optimal medical therapy with Entresto, hydralazine, Aldactone, carvedilol.  Plan for ICD.  Risks and benefits were discussed and include bleeding, tamponade, infection, pneumothorax.  He understands these risks and is agreed to the procedure.  Planned procedure 09/24/2019.  2.  PVCs: Currently on amiodarone.  Zio patch shows a low burden.  No changes.   COVID 19 screen The patient denies symptoms of COVID 19 at this time.  The importance of social distancing was discussed today.  Follow-up:  3 months  Current medicines are reviewed at length with the patient today.   The patient does not have concerns regarding his medicines.  The following changes were made today:  none  Labs/ tests ordered today include:  No orders of the defined types were placed in this encounter.    Patient Risk:  after full review of this patients clinical status, I feel that they are at moderate risk at this time.  Today, I have spent 10 minutes with the patient with telehealth technology discussing ICD.    Signed, Ferrell Claiborne Jorja Loa, MD  09/01/2019 3:19 PM     [facility] [address] [place] [address] [phone] (office) [phone] (fax)

## 2019-09-08 ENCOUNTER — Telehealth: Payer: Self-pay | Admitting: Cardiology

## 2019-09-08 NOTE — Telephone Encounter (Signed)
Wife made aware I scheduled procedure today and send information to billing precert team. Advised to try insurance in several days to follow up. Wife agreeable to plan.

## 2019-09-08 NOTE — Addendum Note (Signed)
Addended by: Baird Lyons on: 09/08/2019 04:46 PM   Modules accepted: Orders

## 2019-09-08 NOTE — Telephone Encounter (Signed)
Patient's wife is calling to find out if pre-cert has been done for Tayvin's upcoming surgery at the end of the month. She states she called the insurance company and nothing has been received.

## 2019-09-22 ENCOUNTER — Other Ambulatory Visit (HOSPITAL_COMMUNITY)
Admission: RE | Admit: 2019-09-22 | Discharge: 2019-09-22 | Disposition: A | Discharge status: Home / Self Care | Payer: 59 | Source: Ambulatory Visit | Attending: Cardiology | Admitting: Cardiology

## 2019-09-22 ENCOUNTER — Telehealth (HOSPITAL_COMMUNITY): Payer: Self-pay

## 2019-09-22 DIAGNOSIS — Z01812 Encounter for preprocedural laboratory examination: Secondary | ICD-10-CM | POA: Diagnosis present

## 2019-09-22 DIAGNOSIS — Z20822 Contact with and (suspected) exposure to covid-19: Secondary | ICD-10-CM | POA: Diagnosis not present

## 2019-09-22 LAB — SARS CORONAVIRUS 2 (TAT 6-24 HRS): SARS Coronavirus 2: NEGATIVE

## 2019-09-22 NOTE — Telephone Encounter (Signed)
Received [employer] disability form from patients wife. Form asking specifically when will patient be able to return to work.  Pt will have ICD implanted on 5/26. Spoke with wife, advised form should be filled out by Careers adviser as he will give specific timeframe for patient being out of work. Advised form to be mailed back to them. Voiced understanding and appreciation.

## 2019-09-23 LAB — CBC
Hematocrit: 38.8 % (ref 37.5–51.0)
Hemoglobin: 13 g/dL (ref 13.0–17.7)
MCH: 32.1 pg (ref 26.6–33.0)
MCHC: 33.5 g/dL (ref 31.5–35.7)
MCV: 96 fL (ref 79–97)
Platelets: 202 10*3/uL (ref 150–450)
RBC: 4.05 x10E6/uL — ABNORMAL LOW (ref 4.14–5.80)
RDW: 12.5 % (ref 11.6–15.4)
WBC: 4.7 10*3/uL (ref 3.4–10.8)

## 2019-09-23 LAB — BASIC METABOLIC PANEL
BUN/Creatinine Ratio: 9 — ABNORMAL LOW (ref 10–24)
BUN: 11 mg/dL (ref 8–27)
CO2: 24 mmol/L (ref 20–29)
Calcium: 9.5 mg/dL (ref 8.6–10.2)
Chloride: 101 mmol/L (ref 96–106)
Creatinine, Ser: 1.16 mg/dL (ref 0.76–1.27)
GFR calc Af Amer: 78 mL/min/{1.73_m2} (ref >59)
GFR calc non Af Amer: 68 mL/min/{1.73_m2} (ref >59)
Glucose: 139 mg/dL — ABNORMAL HIGH (ref 65–99)
Potassium: 4.5 mmol/L (ref 3.5–5.2)
Sodium: 141 mmol/L (ref 134–144)

## 2019-09-24 ENCOUNTER — Ambulatory Visit (HOSPITAL_COMMUNITY)
Admission: RE | Admit: 2019-09-24 | Discharge: 2019-09-24 | Disposition: A | Discharge status: Home / Self Care | Payer: 59 | Attending: Cardiology | Admitting: Cardiology

## 2019-09-24 ENCOUNTER — Ambulatory Visit (HOSPITAL_COMMUNITY): Payer: 59

## 2019-09-24 ENCOUNTER — Other Ambulatory Visit: Payer: Self-pay

## 2019-09-24 ENCOUNTER — Ambulatory Visit (HOSPITAL_COMMUNITY)
Admission: RE | Disposition: A | Discharge status: Home / Self Care | Payer: 59 | Source: Home / Self Care | Attending: Cardiology

## 2019-09-24 DIAGNOSIS — Z95818 Presence of other cardiac implants and grafts: Secondary | ICD-10-CM

## 2019-09-24 DIAGNOSIS — Z87891 Personal history of nicotine dependence: Secondary | ICD-10-CM | POA: Diagnosis not present

## 2019-09-24 DIAGNOSIS — D509 Iron deficiency anemia, unspecified: Secondary | ICD-10-CM | POA: Diagnosis not present

## 2019-09-24 DIAGNOSIS — Z79899 Other long term (current) drug therapy: Secondary | ICD-10-CM | POA: Diagnosis not present

## 2019-09-24 DIAGNOSIS — Z006 Encounter for examination for normal comparison and control in clinical research program: Secondary | ICD-10-CM | POA: Insufficient documentation

## 2019-09-24 DIAGNOSIS — Z880 Allergy status to penicillin: Secondary | ICD-10-CM | POA: Insufficient documentation

## 2019-09-24 DIAGNOSIS — I428 Other cardiomyopathies: Secondary | ICD-10-CM | POA: Diagnosis present

## 2019-09-24 DIAGNOSIS — I5022 Chronic systolic (congestive) heart failure: Secondary | ICD-10-CM | POA: Diagnosis not present

## 2019-09-24 DIAGNOSIS — I110 Hypertensive heart disease with heart failure: Secondary | ICD-10-CM | POA: Diagnosis not present

## 2019-09-24 HISTORY — PX: ICD IMPLANT: EP1208

## 2019-09-24 SURGERY — ICD IMPLANT

## 2019-09-24 MED ORDER — CHLORHEXIDINE GLUCONATE 4 % EX LIQD
4.0000 "application " | Freq: Once | CUTANEOUS | Status: DC
  Filled 2019-09-24: qty 60

## 2019-09-24 MED ORDER — VANCOMYCIN HCL IN DEXTROSE 1-5 GM/200ML-% IV SOLN
INTRAVENOUS | Status: AC
  Filled 2019-09-24: qty 200

## 2019-09-24 MED ORDER — VANCOMYCIN HCL IN DEXTROSE 1-5 GM/200ML-% IV SOLN: 1000.0000 mg | Freq: Two times a day (BID) | INTRAVENOUS | Status: DC

## 2019-09-24 MED ORDER — VANCOMYCIN HCL IN DEXTROSE 1-5 GM/200ML-% IV SOLN
1000.0000 mg | INTRAVENOUS | Status: AC
  Administered 2019-09-24: 1000 mg via INTRAVENOUS

## 2019-09-24 MED ORDER — MIDAZOLAM HCL 5 MG/5ML IJ SOLN
INTRAMUSCULAR | Status: DC | PRN
  Administered 2019-09-24 (×4): 1 mg via INTRAVENOUS

## 2019-09-24 MED ORDER — LIDOCAINE HCL 1 % IJ SOLN
INTRAMUSCULAR | Status: AC
  Filled 2019-09-24: qty 60

## 2019-09-24 MED ORDER — ACETAMINOPHEN 325 MG PO TABS
ORAL_TABLET | ORAL | Status: AC
  Filled 2019-09-24: qty 2

## 2019-09-24 MED ORDER — MIDAZOLAM HCL 5 MG/5ML IJ SOLN
INTRAMUSCULAR | Status: AC
  Filled 2019-09-24: qty 5

## 2019-09-24 MED ORDER — ONDANSETRON HCL 4 MG/2ML IJ SOLN: 4.0000 mg | Freq: Four times a day (QID) | INTRAMUSCULAR | Status: DC | PRN

## 2019-09-24 MED ORDER — SODIUM CHLORIDE 0.9 % IV SOLN
80.0000 mg | INTRAVENOUS | Status: AC
  Administered 2019-09-24: 80 mg

## 2019-09-24 MED ORDER — HEPARIN (PORCINE) IN NACL 1000-0.9 UT/500ML-% IV SOLN
INTRAVENOUS | Status: AC
  Filled 2019-09-24: qty 500

## 2019-09-24 MED ORDER — HEPARIN (PORCINE) IN NACL 1000-0.9 UT/500ML-% IV SOLN
INTRAVENOUS | Status: DC | PRN
  Administered 2019-09-24: 500 mL

## 2019-09-24 MED ORDER — FENTANYL CITRATE (PF) 100 MCG/2ML IJ SOLN
INTRAMUSCULAR | Status: AC
  Filled 2019-09-24: qty 2

## 2019-09-24 MED ORDER — LIDOCAINE HCL (PF) 1 % IJ SOLN
INTRAMUSCULAR | Status: DC | PRN
  Administered 2019-09-24: 60 mL

## 2019-09-24 MED ORDER — SODIUM CHLORIDE 0.9 % IV SOLN
INTRAVENOUS | Status: AC
  Filled 2019-09-24: qty 2

## 2019-09-24 MED ORDER — SODIUM CHLORIDE 0.9 % IV SOLN: INTRAVENOUS | Status: DC

## 2019-09-24 MED ORDER — ACETAMINOPHEN 325 MG PO TABS
325.0000 mg | ORAL_TABLET | ORAL | Status: DC | PRN
Start: 2019-09-24 — End: 2019-09-24
  Administered 2019-09-24: 650 mg via ORAL

## 2019-09-24 MED ORDER — FENTANYL CITRATE (PF) 100 MCG/2ML IJ SOLN
INTRAMUSCULAR | Status: DC | PRN
  Administered 2019-09-24 (×3): 25 ug via INTRAVENOUS

## 2019-09-24 SURGICAL SUPPLY — 8 items
CABLE SURGICAL S-101-97-12 (CABLE) ×3 IMPLANT
ICD EVERA VR XT MRI DVMB1D4 (ICD Generator) ×3 IMPLANT
LEAD CAPSURE NOVUS 5076-52CM (Lead) ×3 IMPLANT
LEAD SPRINT QUAT SEC 6935M-62 (Lead) ×3 IMPLANT
PAD PRO RADIOLUCENT 2001M-C (PAD) ×3 IMPLANT
SHEATH 7FR PRELUDE SNAP 13 (SHEATH) ×3 IMPLANT
SHEATH 9FR PRELUDE SNAP 13 (SHEATH) ×3 IMPLANT
TRAY PACEMAKER INSERTION (PACKS) ×3 IMPLANT

## 2019-09-24 NOTE — H&P (Signed)
ICD Criteria  Current LVEF:25-30%. Within 12 months prior to implant: Yes   Heart failure history: Yes, Class II  Cardiomyopathy history: Yes, Non-Ischemic Cardiomyopathy.  Atrial Fibrillation/Atrial Flutter: No.  Ventricular tachycardia history: No.  Cardiac arrest history: No.  History of syndromes with risk of sudden death: No.  Previous ICD: No.  Current ICD indication: Primary  PPM indication: No.  Class I or II Bradycardia indication present: No  Beta Blocker therapy for 3 or more months: Yes, prescribed.   Ace Inhibitor/ARB therapy for 3 or more months: Yes, prescribed.    I have seen Logan Lopez is a 64 y.o. malepre-procedural and has been referred by [facility] for consideration of ICD implant for primary prevention of sudden death.  The patient's chart has been reviewed and they meet criteria for ICD implant.  I have had a thorough discussion with the patient reviewing options.  The patient and their family (if available) have had opportunities to ask questions and have them answered. The patient and I have decided together through the [facility] Heart Care Share Decision Support Tool to implant ICD at this time.  Risks, benefits, alternatives to ICD implantation were discussed in detail with the patient today. The patient  understands that the risks include but are not limited to bleeding, infection, pneumothorax, perforation, tamponade, vascular damage, renal failure, MI, stroke, death, inappropriate shocks, and lead dislodgement and  wishes to proceed.

## 2019-09-24 NOTE — Progress Notes (Addendum)
[employer] in to see pt. Dr Girard Cooter called states pt CXR was good and pt can be D/C

## 2019-09-24 NOTE — Discharge Instructions (Signed)
After Your ICD (Implantable Cardiac Defibrillator)   . You have a Medtronic ICD  ACTIVITY . Do not lift your arm above shoulder height for 1 week after your procedure. After 7 days, you may progress as below.     Wednesday October 01, 2019  Thursday October 02, 2019 Friday October 03, 2019 Saturday October 04, 2019   . Do not lift, push, pull, or carry anything over 10 pounds with the affected arm until 6 weeks (Wednesday November 05, 2019 ) after your procedure.   . Do NOT DRIVE until you have been seen for your wound check, or as long as instructed by your healthcare provider.   . Ask your healthcare provider when you can go back to work   INCISION/Dressing . If you are on a blood thinner such as Coumadin, Xarelto, Eliquis, Plavix, or Pradaxa please confirm with your provider when this should be resumed.  . Monitor your defibrillator site for redness, swelling, and drainage. Call the device clinic at [phone] if you experience these symptoms or fever/chills.  . If your incision is sealed with Steri-strips or staples, you may shower 10 days after your procedure or when told by your provider. Do not remove the steri-strips or let the shower hit directly on your site. You may wash around your site with soap and water.    Marland Kitchen Avoid lotions, ointments, or perfumes over your incision until it is well-healed.  . You may use a hot tub or a pool AFTER your wound check appointment if the incision is completely closed.  . Your ICD is designed to protect you from life threatening heart rhythms. Because of this, you may receive a shock.   o 1 shock with no symptoms:  Call the office during business hours. o 1 shock with symptoms (chest pain, chest pressure, dizziness, lightheadedness, shortness of breath, overall feeling unwell):  Call 911. o If you experience 2 or more shocks in 24 hours:  Call 911. o If you receive a shock, you should not drive for 6 months per the [place] DMV IF you receive appropriate therapy  from your ICD.   . ICD Alerts:  Some alerts are vibratory and others beep. These are NOT emergencies. Please call our office to let us know. If this occurs at night or on weekends, it can wait until the next business day. Send a remote transmission.  . If your device is capable of reading fluid status (for heart failure), you will be offered monthly monitoring to review this with you.   DEVICE MANAGEMENT . Remote monitoring is used to monitor your ICD from home. This monitoring is scheduled every 91 days by our office. It allows [place] to keep an eye on the functioning of your device to ensure it is working properly. You will routinely see your Electrophysiologist annually (more often if necessary).   . You should receive your ID card for your new device in 4-8 weeks. Keep this card with you at all times once received. Consider wearing a medical alert bracelet or necklace.  . Your ICD  may be MRI compatible. This will be discussed at your next office visit/wound check.  You should avoid contact with strong electric or magnetic fields.    Do not use amateur (ham) radio equipment or electric (arc) welding torches. MP3 player headphones with magnets should not be used. Some devices are safe to use if held at least 12 inches (30 cm) from your defibrillator. These include power tools, lawn mowers,  and speakers. If you are unsure if something is safe to use, ask your health care provider.   When using your cell phone, hold it to the ear that is on the opposite side from the defibrillator. Do not leave your cell phone in a pocket over the defibrillator.   You may safely use electric blankets, heating pads, computers, and microwave ovens.  Call the office right away if:  You have chest pain.  You feel more than one shock.  You feel more short of breath than you have felt before.  You feel more light-headed than you have felt before.  Your incision starts to open up.  This information is not  intended to replace advice given to you by your health care provider. Make sure you discuss any questions you have with your health care provider.

## 2019-09-24 NOTE — Progress Notes (Signed)
bp 96/68, 82/52 with paced rate of 60, pt asymptomatic, paged Mardelle Matte PA/ EP to update pt status.

## 2019-09-25 MED FILL — Lidocaine HCl Local Inj 1%: INTRAMUSCULAR | Qty: 60 | Status: AC

## 2019-10-06 ENCOUNTER — Other Ambulatory Visit (HOSPITAL_COMMUNITY): Payer: Self-pay | Admitting: Cardiology

## 2019-10-06 ENCOUNTER — Other Ambulatory Visit (HOSPITAL_COMMUNITY): Payer: Self-pay | Admitting: Adult Health

## 2019-10-07 ENCOUNTER — Ambulatory Visit: Payer: 59

## 2019-10-09 ENCOUNTER — Ambulatory Visit (INDEPENDENT_AMBULATORY_CARE_PROVIDER_SITE_OTHER): Payer: 59 | Admitting: Emergency Medicine

## 2019-10-09 ENCOUNTER — Other Ambulatory Visit: Payer: Self-pay

## 2019-10-09 DIAGNOSIS — Z95810 Presence of automatic (implantable) cardiac defibrillator: Secondary | ICD-10-CM

## 2019-10-09 DIAGNOSIS — I428 Other cardiomyopathies: Secondary | ICD-10-CM

## 2019-10-09 DIAGNOSIS — I5022 Chronic systolic (congestive) heart failure: Secondary | ICD-10-CM | POA: Diagnosis not present

## 2019-10-09 LAB — CUP PACEART INCLINIC DEVICE CHECK
Battery Remaining Longevity: 123 mo
Battery Voltage: 3.13 V
Brady Statistic AP VP Percent: 0.02 %
Brady Statistic AP VS Percent: 38.22 %
Brady Statistic AS VP Percent: 0.02 %
Brady Statistic AS VS Percent: 61.74 %
Brady Statistic RA Percent Paced: 38.24 %
Brady Statistic RV Percent Paced: 0.04 %
Date Time Interrogation Session: 20210610100806
HighPow Impedance: 69 Ohm
Implantable Lead Implant Date: 20210526
Implantable Lead Implant Date: 20210526
Implantable Lead Location: 753859
Implantable Lead Location: 753860
Implantable Lead Model: 5076
Implantable Pulse Generator Implant Date: 20210526
Lead Channel Impedance Value: 380 Ohm
Lead Channel Impedance Value: 399 Ohm
Lead Channel Impedance Value: 494 Ohm
Lead Channel Pacing Threshold Amplitude: 0.5 V
Lead Channel Pacing Threshold Amplitude: 0.75 V
Lead Channel Pacing Threshold Pulse Width: 0.4 ms
Lead Channel Pacing Threshold Pulse Width: 0.4 ms
Lead Channel Sensing Intrinsic Amplitude: 4 mV
Lead Channel Sensing Intrinsic Amplitude: 6.125 mV
Lead Channel Setting Pacing Amplitude: 3.5 V
Lead Channel Setting Pacing Amplitude: 3.5 V
Lead Channel Setting Pacing Pulse Width: 0.4 ms
Lead Channel Setting Sensing Sensitivity: 0.3 mV

## 2019-10-09 NOTE — Progress Notes (Signed)
Wound check appointment. Steri-strips removed. Wound without redness or edema. Incision edges approximated, wound well healed. Normal device function. Thresholds, sensing, and impedances consistent with implant measurements. Device programmed at 3.5V with auto capture enabled for extra safety margin until 3 month visit. Histogram distribution appropriate for patient and level of activity. No mode switches or ventricular arrhythmias noted. Patient educated about wound care, arm mobility, lifting restrictions, shock plan, and Carelink monitor. Carelink on 12/24/19 and ROV with Dr. Elberta Fortis in [facility] on 01/12/20.

## 2019-10-31 ENCOUNTER — Telehealth (HOSPITAL_COMMUNITY): Payer: Self-pay | Admitting: Pharmacy Technician

## 2019-10-31 NOTE — Telephone Encounter (Signed)
Patient Advocate Encounter   Received notification from [employer] that prior authorization for Logan Lopez is required.   PA submitted on CoverMyMeds Key [id number] Status is pending   Will continue to follow.

## 2019-11-07 NOTE — Telephone Encounter (Signed)
Advanced [facility] Failure Patient Advocate Encounter  Prior Authorization for Sherryll Burger has been approved.     Effective dates: 11/05/19 through 11/04/20  Called and left message regarding approval.  Archer Asa, CPhT

## 2019-11-10 ENCOUNTER — Ambulatory Visit: Payer: 59 | Admitting: Cardiology

## 2019-11-18 ENCOUNTER — Ambulatory Visit (HOSPITAL_COMMUNITY)
Admission: RE | Admit: 2019-11-18 | Discharge: 2019-11-18 | Disposition: A | Discharge status: Home / Self Care | Payer: 59 | Source: Ambulatory Visit | Attending: Cardiology | Admitting: Cardiology

## 2019-11-18 ENCOUNTER — Other Ambulatory Visit: Payer: Self-pay

## 2019-11-18 ENCOUNTER — Encounter (HOSPITAL_COMMUNITY): Payer: Self-pay | Admitting: Cardiology

## 2019-11-18 VITALS — BP 110/60 | HR 60 | Wt 153.0 lb

## 2019-11-18 DIAGNOSIS — Z95810 Presence of automatic (implantable) cardiac defibrillator: Secondary | ICD-10-CM | POA: Insufficient documentation

## 2019-11-18 DIAGNOSIS — Z7722 Contact with and (suspected) exposure to environmental tobacco smoke (acute) (chronic): Secondary | ICD-10-CM | POA: Insufficient documentation

## 2019-11-18 DIAGNOSIS — I110 Hypertensive heart disease with heart failure: Secondary | ICD-10-CM | POA: Diagnosis not present

## 2019-11-18 DIAGNOSIS — I472 Ventricular tachycardia, unspecified: Secondary | ICD-10-CM

## 2019-11-18 DIAGNOSIS — I5022 Chronic systolic (congestive) heart failure: Secondary | ICD-10-CM | POA: Insufficient documentation

## 2019-11-18 DIAGNOSIS — I493 Ventricular premature depolarization: Secondary | ICD-10-CM | POA: Insufficient documentation

## 2019-11-18 DIAGNOSIS — Z87891 Personal history of nicotine dependence: Secondary | ICD-10-CM | POA: Diagnosis not present

## 2019-11-18 DIAGNOSIS — Z8249 Family history of ischemic heart disease and other diseases of the circulatory system: Secondary | ICD-10-CM | POA: Diagnosis not present

## 2019-11-18 DIAGNOSIS — R5383 Other fatigue: Secondary | ICD-10-CM | POA: Insufficient documentation

## 2019-11-18 DIAGNOSIS — Z79899 Other long term (current) drug therapy: Secondary | ICD-10-CM | POA: Insufficient documentation

## 2019-11-18 DIAGNOSIS — I428 Other cardiomyopathies: Secondary | ICD-10-CM | POA: Diagnosis not present

## 2019-11-18 LAB — COMPREHENSIVE METABOLIC PANEL
ALT: 29 U/L (ref 0–44)
AST: 32 U/L (ref 15–41)
Albumin: 3.7 g/dL (ref 3.5–5.0)
Alkaline Phosphatase: 83 U/L (ref 38–126)
Anion gap: 11 (ref 5–15)
BUN: 13 mg/dL (ref 8–23)
CO2: 23 mmol/L (ref 22–32)
Calcium: 9.2 mg/dL (ref 8.9–10.3)
Chloride: 102 mmol/L (ref 98–111)
Creatinine, Ser: 1.07 mg/dL (ref 0.61–1.24)
GFR calc Af Amer: >60 mL/min (ref >60)
GFR calc non Af Amer: >60 mL/min (ref >60)
Glucose, Bld: 117 mg/dL — ABNORMAL HIGH (ref 70–99)
Potassium: 4.1 mmol/L (ref 3.5–5.1)
Sodium: 136 mmol/L (ref 135–145)
Total Bilirubin: 0.6 mg/dL (ref 0.3–1.2)
Total Protein: 7.5 g/dL (ref 6.5–8.1)

## 2019-11-18 LAB — TSH: TSH: 0.498 u[IU]/mL (ref 0.350–4.500)

## 2019-11-18 LAB — DIGOXIN LEVEL: Digoxin Level: 0.8 ng/mL (ref 0.8–2.0)

## 2019-11-18 NOTE — Progress Notes (Signed)
PCP: [facility].  Primary Cardiologist: Dr Shirlee Latch   HPI: Logan Lopez is a 64 y.o. with a history of iron deficient anemia, former smoker (1 pack every 2 days ), chronic systolic heart failure, and PVCs.   Admitted 03/2019 increased dyspnea. ECHO showed reduced EF 25-30%. LHC/RHC completed and showed low cardiac ouput/index and no significant coronary disease. While hospitalized he had high PVC burden so amiodarone was started. Discharged with [facility].   Echo in 2/21 showed EF 30-35%, diffuse hypokinesis, mildly decreased RV systolic function.  Zio patch monitor in 3/21 showed no significant arrhythmias.  He had Medtronic ICD placed in 5/21.   He returns for followup of CHF. He walks about 1/2 mile/day without dyspnea.  Generally no dyspnea on flat ground.  No lightheadedness.  Sometimes, he will feel "sluggish" for now particular reason.  Energy level in general is not great.  No longer smoking.    Labs (1/21): digoxin 0.8, TSH normal, K 4.1, creatinine 0.85, AST 47, ALT 51 Labs (4/21): LFTs, TSH normal Labs (5/21): K 4.5, creatinine 1.16  SH: Stopped drinking in 11/20. Worked at [employer] in the  [employer]. Lives with his wife. Active smoker.     PMH 1. Fe deficiency anemia.  2. Tobacco Abuse 3. Chronic systolic CHF: Nonischemic cardiomyopathy.  - 03/10/19 ECHO EF 25-30%  - RHC/LHC (11/20): mean RA 3, PA 41/21, mean PCWP 17, CI 1.9, PVR 3.75 WU; no significant coronary disease.  - Echo (2/21): EF 30-35%, diffuse hypokinesis, mildly decreased RV systolic function.  - Medtronic ICD 4. PVCs: frequent.   - Zio patch monitor in 3/21 on amiodarone showed no significant arrhythmias.  5. HTN  ROS: All systems negative except as listed in HPI, PMH and Problem List.  FH:  Family History  Problem Relation Age of Onset  . Hypertension Mother   . Kidney disease Mother     Current Outpatient Medications  Medication Sig Dispense Refill  . acetaminophen  (TYLENOL) 500 MG tablet Take 1,000 mg by mouth every 6 (six) hours as needed for mild pain.    . carvedilol (COREG) 6.25 MG tablet Take 1 tablet (6.25 mg total) by mouth 2 (two) times daily. 60 tablet 3  . digoxin (LANOXIN) 0.125 MG tablet Take 1 tablet by mouth once daily 90 tablet 3  . ferrous sulfate 325 (65 FE) MG tablet Take 325 mg by mouth daily with breakfast.    . hydrALAZINE (APRESOLINE) 25 MG tablet TAKE 1 TABLET(25 MG) BY MOUTH THREE TIMES DAILY (Patient taking differently: Take 25 mg by mouth 3 (three) times daily. TAKE 1 TABLET(25 MG) BY MOUTH THREE TIMES DAILY) 90 tablet 5  . isosorbide mononitrate (IMDUR) 30 MG 24 hr tablet Take 1 tablet by mouth once daily 90 tablet 3  . sacubitril-valsartan (ENTRESTO) 49-51 MG Take 1 tablet by mouth 2 (two) times daily. 180 tablet 3  . spironolactone (ALDACTONE) 25 MG tablet TAKE 1 TABLET(25 MG) BY MOUTH DAILY (Patient taking differently: Take 25 mg by mouth daily. ) 30 tablet 3  . vitamin C (ASCORBIC ACID) 500 MG tablet Take 500 mg by mouth daily.    . furosemide (LASIX) 40 MG tablet Take 1 tablet (40 mg total) by mouth as needed. (Patient not taking: Reported on 11/18/2019) 30 tablet 0   No current facility-administered medications for this encounter.    Vitals:   11/18/19 1338  BP: 110/60  Pulse: 60  SpO2: 98%  Weight: 69.4 kg (153 lb)  Wt Readings from Last 3 Encounters:  11/18/19 69.4 kg (153 lb)  09/24/19 69.9 kg (154 lb)  08/11/19 69.9 kg (154 lb 3.2 oz)    PHYSICAL EXAM: General: NAD Neck: No JVD, no thyromegaly or thyroid nodule.  Lungs: Mild crackles at bases.  CV: Nondisplaced PMI.  Heart regular S1/S2, no S3/S4, no murmur.  No peripheral edema.  No carotid bruit.  Normal pedal pulses.  Abdomen: Soft, nontender, no hepatosplenomegaly, no distention.  Skin: Intact without lesions or rashes.  Neurologic: Alert and oriented x 3.  Psych: Normal affect. Extremities: No clubbing or cyanosis.  HEENT: Normal.   ASSESSMENT &  PLAN: 1. Chronic Systolic CHF: Nonischemic cardiomyopathy, echo 03/10/19 with EF 25-30%, RV ok. No family history of cardiomyopathy. No significant coronary disease on cath in 11/20. No definite pre-existing viral symptoms. It is possible that this could be a PVC-mediated CMP given frequent PVCs at the time of diagnosis (or that the cardiomyopathy itself could be driving the PVCs). RHC in 11/20 showed low cardiac index at 1.8. CT chest in 11/20 with no evidence for sarcoid. Echo in 2/21 showed that EF remains 25-30%. MDT ICD placed in 5/21.  NYHA class II symptoms, he is not volume overloaded on exam.  - He does not appear to need a diuretic.  - Continue Coreg 6.25 mg bid. Will hold off on increasing due to his generalized fatigue.  - Continue digoxin 0.125 daily, check level today.  - Continue hydralazine  25 mg three times a day + 30 mg Imdur daily.  - Continue spiro to 25 mg daily.  BMET today.  - Continue Entresto 24/26 bid.   - I will arrange for cardiac MRI to assess for infiltrative disease if his ICD is MRI compatible. .  2. PVCs/NSVT: Very frequent when initially diagnosed in 11/20. ?Cause of cardiomyopathy versus result of cardiomyopathy. Decreased PVCs now on amiodarone => Zio patch on amiodarone 100 mg daily showed no arrhythmias.  He now has a MDT ICD.  - I would like to try him off coming off amiodarone.  He will stop it, I will monitor closely for increase in ventricular arrhythmias.  3. Smoking: No longer smoking   Followup in 3 months.   Marca Ancona 11/18/2019

## 2019-11-18 NOTE — Patient Instructions (Signed)
Stop Amiodarone  Labs done today, your results will be available in MyChart, we will contact you for abnormal readings.  Your physician has requested that you have a cardiac MRI. Cardiac MRI uses a computer to create images of your heart as its beating, producing both still and moving pictures of your heart and major blood vessels. For further information please visit [email or web address]. Please follow the instruction sheet given to you today for more information. ONCE APPROVED BY HEALTH INSURANCE YOU WILL BE CONTACTED TO SCHEDULE THIS  Your physician recommends that you schedule a follow-up appointment in: 3 months  If you have any questions or concerns before your next appointment please send [place] a message through [place] or call our office at [phone].    TO LEAVE A MESSAGE FOR THE NURSE SELECT OPTION 2, PLEASE LEAVE A MESSAGE INCLUDING:  YOUR NAME  DATE OF BIRTH  CALL BACK NUMBER  REASON FOR CALL**this is important as we prioritize the call backs  YOU WILL RECEIVE A CALL BACK THE SAME DAY AS LONG AS YOU CALL BEFORE 4:00 PM  At the [facility], you and your health needs are our priority. As part of our continuing mission to provide you with exceptional heart care, we have created designated Provider Care Teams. These Care Teams include your primary Cardiologist (physician) and Advanced Practice Providers (APPs- Physician Assistants and Nurse Practitioners) who all work together to provide you with the care you need, when you need it.   You may see any of the following providers on your designated Care Team at your next follow up:  Dr Arvilla Meres  Dr Carron Curie, NP  Robbie Lis, [place]  Karle Plumber, PharmD   Please be sure to bring in all your medications bottles to every appointment.

## 2019-11-18 NOTE — Progress Notes (Signed)
See MD note.

## 2019-12-08 ENCOUNTER — Other Ambulatory Visit: Payer: Self-pay | Admitting: Internal Medicine

## 2019-12-08 ENCOUNTER — Other Ambulatory Visit (HOSPITAL_COMMUNITY): Payer: Self-pay | Admitting: Cardiology

## 2019-12-11 ENCOUNTER — Other Ambulatory Visit (HOSPITAL_COMMUNITY): Payer: Self-pay

## 2019-12-11 MED ORDER — CARVEDILOL 6.25 MG PO TABS: 6.2500 mg | ORAL_TABLET | Freq: Two times a day (BID) | ORAL | 3 refills | Status: DC

## 2019-12-11 NOTE — Telephone Encounter (Signed)
Patients wife called wanting to clarify how patient is suppose to be taking his Carvedilol. His Rx stated 6.25mg  (1/2 tablet) bid. However, upon further investigation it looks like patient was suppose to be taking 1 full tablet bid. Rx was updated and sent into patients pharmacy on file. Patients wife was very Adult nurse

## 2019-12-24 ENCOUNTER — Ambulatory Visit (INDEPENDENT_AMBULATORY_CARE_PROVIDER_SITE_OTHER): Payer: 59 | Admitting: *Deleted

## 2019-12-24 DIAGNOSIS — I428 Other cardiomyopathies: Secondary | ICD-10-CM

## 2019-12-24 DIAGNOSIS — I5022 Chronic systolic (congestive) heart failure: Secondary | ICD-10-CM

## 2019-12-25 LAB — CUP PACEART REMOTE DEVICE CHECK
Battery Remaining Longevity: 111 mo
Battery Voltage: 3.11 V
Brady Statistic AP VP Percent: 0.02 %
Brady Statistic AP VS Percent: 49.54 %
Brady Statistic AS VP Percent: 0.02 %
Brady Statistic AS VS Percent: 50.42 %
Brady Statistic RA Percent Paced: 49.56 %
Brady Statistic RV Percent Paced: 0.04 %
Date Time Interrogation Session: 20210825043724
HighPow Impedance: 67 Ohm
Implantable Lead Implant Date: 20210526
Implantable Lead Implant Date: 20210526
Implantable Lead Location: 753859
Implantable Lead Location: 753860
Implantable Lead Model: 5076
Implantable Pulse Generator Implant Date: 20210526
Lead Channel Impedance Value: 437 Ohm
Lead Channel Impedance Value: 456 Ohm
Lead Channel Impedance Value: 532 Ohm
Lead Channel Pacing Threshold Amplitude: 0.5 V
Lead Channel Pacing Threshold Amplitude: 0.625 V
Lead Channel Pacing Threshold Pulse Width: 0.4 ms
Lead Channel Pacing Threshold Pulse Width: 0.4 ms
Lead Channel Sensing Intrinsic Amplitude: 3 mV
Lead Channel Sensing Intrinsic Amplitude: 3 mV
Lead Channel Sensing Intrinsic Amplitude: 6.625 mV
Lead Channel Sensing Intrinsic Amplitude: 6.625 mV
Lead Channel Setting Pacing Amplitude: 3.25 V
Lead Channel Setting Pacing Amplitude: 3.25 V
Lead Channel Setting Pacing Pulse Width: 0.4 ms
Lead Channel Setting Sensing Sensitivity: 0.3 mV

## 2019-12-29 NOTE — Progress Notes (Signed)
Remote ICD transmission.   

## 2020-01-07 ENCOUNTER — Telehealth (HOSPITAL_COMMUNITY): Payer: Self-pay | Admitting: Emergency Medicine

## 2020-01-07 NOTE — Telephone Encounter (Signed)
Unable to leave VM, VM box full

## 2020-01-08 ENCOUNTER — Ambulatory Visit (HOSPITAL_COMMUNITY)
Admission: RE | Admit: 2020-01-08 | Discharge: 2020-01-08 | Disposition: A | Discharge status: Home / Self Care | Payer: 59 | Source: Ambulatory Visit | Attending: Cardiology | Admitting: Cardiology

## 2020-01-08 ENCOUNTER — Other Ambulatory Visit: Payer: Self-pay

## 2020-01-08 DIAGNOSIS — I5022 Chronic systolic (congestive) heart failure: Secondary | ICD-10-CM | POA: Diagnosis not present

## 2020-01-08 MED ORDER — GADOBUTROL 1 MMOL/ML IV SOLN
10.0000 mL | Freq: Once | INTRAVENOUS | Status: AC | PRN
  Administered 2020-01-08: 10 mL via INTRAVENOUS

## 2020-01-08 NOTE — Progress Notes (Signed)
Informed of MRI for today.   Device system confirmed to be MRI conditional, with implant date > 6 weeks ago and no evidence of abandoned or epicardial leads in review of most recent CXR Interrogation from today reviewed, pt is currently AS-VS at ~75 bpm Change device settings for MRI to DOO at 90 bpm  Tachy-therapies to off if applicable.  Program device back to pre-MRI settings after completion of exam.  Graciella Freer, PA-C  01/08/2020 1:06 PM

## 2020-01-08 NOTE — Progress Notes (Signed)
Changed device settings for MRI to DOO at 90 bpm per order.   Tachy-therapies to off if applicable.    Will program device back to pre-MRI settings after completion of exam.

## 2020-01-12 ENCOUNTER — Encounter: Payer: 59 | Admitting: Cardiology

## 2020-01-13 ENCOUNTER — Telehealth (HOSPITAL_COMMUNITY): Payer: Self-pay

## 2020-01-13 NOTE — Telephone Encounter (Signed)
Tried calling patient, no answer,unable to leave message. Letter mailed

## 2020-01-13 NOTE — Telephone Encounter (Signed)
-----   Message from Laurey Morale, MD sent at 01/08/2020  5:45 PM EDT ----- Very difficult images due to ICD.  No definite gadolinium enhancement, so no definite sign of myocarditis or infiltrative disease.

## 2020-01-21 ENCOUNTER — Telehealth (HOSPITAL_COMMUNITY): Payer: Self-pay

## 2020-01-21 NOTE — Telephone Encounter (Signed)
-  patients wife advised and verbalized understanding    ---- Message from Laurey Morale, MD sent at 01/08/2020  5:45 PM EDT ----- Very difficult images due to ICD.  No definite gadolinium enhancement, so no definite sign of myocarditis or infiltrative disease.

## 2020-01-26 ENCOUNTER — Encounter: Payer: Self-pay | Admitting: Cardiology

## 2020-01-26 ENCOUNTER — Ambulatory Visit (INDEPENDENT_AMBULATORY_CARE_PROVIDER_SITE_OTHER): Payer: 59 | Admitting: Cardiology

## 2020-01-26 ENCOUNTER — Other Ambulatory Visit: Payer: Self-pay

## 2020-01-26 VITALS — BP 102/62 | HR 69 | Ht 72.0 in | Wt 155.8 lb

## 2020-01-26 DIAGNOSIS — I428 Other cardiomyopathies: Secondary | ICD-10-CM

## 2020-01-26 NOTE — Patient Instructions (Signed)
Medication Instructions:  Your physician recommends that you continue on your current medications as directed. Please refer to the Current Medication list given to you today.  *If you need a refill on your cardiac medications before your next appointment, please call your pharmacy*   Lab Work: None ordered If you have labs (blood work) drawn today and your tests are completely normal, you will receive your results only by: Marland Kitchen MyChart Message (if you have MyChart) OR . A paper copy in the mail If you have any lab test that is abnormal or we need to change your treatment, we will call you to review the results.   Testing/Procedures: None ordered   Follow-Up: At [facility], you and your health needs are our priority.  As part of our continuing mission to provide you with exceptional heart care, we have created designated Provider Care Teams.  These Care Teams include your primary Cardiologist (physician) and Advanced Practice Providers (APPs -  Physician Assistants and Nurse Practitioners) who all work together to provide you with the care you need, when you need it.  We recommend signing up for the patient portal called "MyChart".  Sign up information is provided on this After Visit Summary.  MyChart is used to connect with patients for Virtual Visits (Telemedicine).  Patients are able to view lab/test results, encounter notes, upcoming appointments, etc.  Non-urgent messages can be sent to your provider as well.   To learn more about what you can do with MyChart, go to [email or web address].    Remote monitoring is used to monitor your Pacemaker or ICD from home. This monitoring reduces the number of office visits required to check your device to one time per year. It allows [place] to keep an eye on the functioning of your device to ensure it is working properly. You are scheduled for a device check from home on 03/24/2020. You may send your transmission at any time that day. If you have a  wireless device, the transmission will be sent automatically. After your physician reviews your transmission, you will receive a postcard with your next transmission date.  Your next appointment:   9 month(s)  The format for your next appointment:   In Person  Provider:   Loman Brooklyn, MD   Thank you for choosing [facility]!!   Dory Horn, RN [phone]

## 2020-01-26 NOTE — Progress Notes (Signed)
Electrophysiology Office Note   Date:  01/26/2020   ID:  Logan Lopez, Logan Lopez January 04, 1956, MRN [id number]  PCP:  Claiborne Rigg, NP  Cardiologist:  Shirlee Latch Primary Electrophysiologist:  Tawan Corkern Jorja Loa, MD    Chief Complaint: CHF   History of Present Illness: Logan Lopez is a 64 y.o. male who is being seen today for the evaluation of CHF at the request of Claiborne Rigg, NP. Presenting today for electrophysiology evaluation.  He has a history of deficiency anemia, former tobacco abuse, PVCs, and chronic [place] letter due to nonischemic cardiomyopathy.  He presented to the hospital November 2020 with dyspnea and was found to have an ejection fraction of 25 to 30%.  Cardiac cath showed low output but no treatment for coronary artery disease.  He was noted to have a high PVC burden while he was in the hospital and was put on amiodarone.  He is now status post Medtronics dual-chamber ICD implanted 09/24/2019.    Today, denies symptoms of palpitations, chest pain, shortness of breath, orthopnea, PND, lower extremity edema, claudication, dizziness, presyncope, syncope, bleeding, or neurologic sequela. The patient is tolerating medications without difficulties.  He is feeling well.  No chest pain or shortness of breath.  Is able do all his daily activities without restriction.  Past Medical History:  Diagnosis Date  . CHF (congestive heart failure) (HCC)    new 03/2019  . Dyspnea   . Hypertension    Past Surgical History:  Procedure Laterality Date  . ICD IMPLANT N/A 09/24/2019   Procedure: ICD IMPLANT;  Surgeon: Regan Lemming, MD;  Location: [facility] INVASIVE CV LAB;  Service: Cardiovascular;  Laterality: N/A;  . RIGHT/LEFT HEART CATH AND CORONARY ANGIOGRAPHY N/A 03/12/2019   Procedure: RIGHT/LEFT HEART CATH AND CORONARY ANGIOGRAPHY;  Surgeon: Laurey Morale, MD;  Location: [facility] INVASIVE CV LAB;  Service: Cardiovascular;  Laterality: N/A;  . WISDOM TOOTH EXTRACTION        Current Outpatient Medications  Medication Sig Dispense Refill  . acetaminophen (TYLENOL) 500 MG tablet Take 1,000 mg by mouth every 6 (six) hours as needed for mild pain.    . carvedilol (COREG) 6.25 MG tablet Take 1 tablet (6.25 mg total) by mouth 2 (two) times daily with a meal. 180 tablet 3  . digoxin (LANOXIN) 0.125 MG tablet Take 1 tablet by mouth once daily 90 tablet 3  . ferrous sulfate 325 (65 FE) MG tablet Take 325 mg by mouth daily with breakfast.    . furosemide (LASIX) 40 MG tablet Take 1 tablet (40 mg total) by mouth as needed. 30 tablet 0  . hydrALAZINE (APRESOLINE) 25 MG tablet TAKE 1 TABLET(25 MG) BY MOUTH THREE TIMES DAILY 90 tablet 5  . isosorbide mononitrate (IMDUR) 30 MG 24 hr tablet Take 1 tablet by mouth once daily 90 tablet 3  . sacubitril-valsartan (ENTRESTO) 49-51 MG Take 1 tablet by mouth 2 (two) times daily. 180 tablet 3  . spironolactone (ALDACTONE) 25 MG tablet Take 1 tablet by mouth once daily 90 tablet 3  . vitamin C (ASCORBIC ACID) 500 MG tablet Take 500 mg by mouth daily.     No current facility-administered medications for this visit.    Allergies:   Penicillins   Social History:  The patient  reports that he has quit smoking. His smoking use included cigarettes. He quit after 9.00 years of use. He has never used smokeless tobacco. He reports previous alcohol use. He reports that he does not  use drugs.   Family History:   No family history of heart disease.  Brothers are all healthy.  Otherwise he is unaware of medical issues in his family.  ROS:  Please see the history of present illness.   Otherwise, review of systems is positive for none.   All other systems are reviewed and negative.   PHYSICAL EXAM: VS:  BP 102/62   Pulse 69   Ht 6' (1.829 m)   Wt 155 lb 12.8 oz (70.7 kg)   SpO2 97%   BMI 21.13 kg/m  , BMI Body mass index is 21.13 kg/m. GEN: Well nourished, well developed, in no acute distress  HEENT: normal  Neck: no JVD, carotid  bruits, or masses Cardiac: RRR; no murmurs, rubs, or gallops,no edema  Respiratory:  clear to auscultation bilaterally, normal work of breathing GI: soft, nontender, nondistended, + BS MS: no deformity or atrophy  Skin: warm and dry Neuro:  Strength and sensation are intact Psych: euthymic mood, full affect  EKG:  EKG is ordered today. Personal review of the ekg ordered shows sinus rhythm, rate 69  Recent Labs: 03/10/2019: B Natriuretic Peptide 1,080.2 03/20/2019: Magnesium 2.1 09/22/2019: Hemoglobin 13.0; Platelets 202 11/18/2019: ALT 29; BUN 13; Creatinine, Ser 1.07; Potassium 4.1; Sodium 136; TSH 0.498    Lipid Panel     Component Value Date/Time   CHOL 134 03/12/2019 0340   TRIG 81 03/12/2019 0340   HDL 28 (L) 03/12/2019 0340   CHOLHDL 4.8 03/12/2019 0340   VLDL 16 03/12/2019 0340   LDLCALC 90 03/12/2019 0340     Wt Readings from Last 3 Encounters:  01/26/20 155 lb 12.8 oz (70.7 kg)  11/18/19 153 lb (69.4 kg)  09/24/19 154 lb (69.9 kg)      Other studies Reviewed: Additional studies/ records that were reviewed today include: TTE 06/20/19  Review of the above records today demonstrates:  1. Left ventricular ejection fraction, by estimation, is 30 to 35%. The  left ventricle has moderately decreased function. The left ventricle  demonstrates global hypokinesis. Left ventricular diastolic parameters are  consistent with Grade I diastolic  dysfunction (impaired relaxation).  2. Right ventricular systolic function is mildly reduced. The right  ventricular size is normal. There is normal pulmonary artery systolic  pressure. The estimated right ventricular systolic pressure is 19.8 mmHg.  3. The mitral valve is normal in structure and function. Trivial mitral  valve regurgitation. No evidence of mitral stenosis.  4. The aortic valve is tricuspid. Aortic valve regurgitation is trivial.  No aortic stenosis is present.  5. The inferior vena cava is normal in size with  greater than 50%  respiratory variability, suggesting right atrial pressure of 3 mmHg.   RHC/LHC 03/12/19 1. Mild elevation in PCWP, normal RA pressure.  2. Mild primarily pulmonary venous hypertension.  3. Low cardiac output, CI 1.8.  4. No significant coronary disease, nonischemic cardiomyopathy.   Monitor 07/13/19 personally reviewed NSR.  Relatively normal study with no significant arrhythmia noted.   ASSESSMENT AND PLAN:  1.  Chronic systolic heart failure due to nonischemic cardiomyopathy: Ejection fraction 30 to 35%.  Currently on optimal medical therapy.  Status post Medtronic dual-chamber ICD implanted 09/24/2019.  Device functioning appropriately.  No changes.  2: PVCs: Burden decreased on amiodarone.     Current medicines are reviewed at length with the patient today.   The patient does not have concerns regarding his medicines.  The following changes were made today:  none  Labs/ tests  ordered today include:  Orders Placed This Encounter  Procedures  . EKG 12-Lead     Disposition:   FU with Rector [place] 9 months  Signed, Yerlin Gasparyan Jorja Loa, MD  01/26/2020 2:43 PM     [facility] [address] [place] [address] [phone] (office) [phone] (fax)

## 2020-01-29 ENCOUNTER — Other Ambulatory Visit: Payer: Self-pay

## 2020-01-29 ENCOUNTER — Encounter (HOSPITAL_BASED_OUTPATIENT_CLINIC_OR_DEPARTMENT_OTHER): Payer: Self-pay | Admitting: *Deleted

## 2020-01-29 ENCOUNTER — Emergency Department (HOSPITAL_BASED_OUTPATIENT_CLINIC_OR_DEPARTMENT_OTHER)
Admission: EM | Admit: 2020-01-29 | Discharge: 2020-01-29 | Disposition: A | Discharge status: Home / Self Care | Payer: 59 | Attending: Emergency Medicine | Admitting: Emergency Medicine

## 2020-01-29 DIAGNOSIS — I509 Heart failure, unspecified: Secondary | ICD-10-CM | POA: Diagnosis not present

## 2020-01-29 DIAGNOSIS — K0889 Other specified disorders of teeth and supporting structures: Secondary | ICD-10-CM | POA: Insufficient documentation

## 2020-01-29 DIAGNOSIS — Z87891 Personal history of nicotine dependence: Secondary | ICD-10-CM | POA: Insufficient documentation

## 2020-01-29 DIAGNOSIS — I110 Hypertensive heart disease with heart failure: Secondary | ICD-10-CM | POA: Insufficient documentation

## 2020-01-29 DIAGNOSIS — Z79899 Other long term (current) drug therapy: Secondary | ICD-10-CM | POA: Insufficient documentation

## 2020-01-29 MED ORDER — CLINDAMYCIN HCL 300 MG PO CAPS: 300.0000 mg | ORAL_CAPSULE | Freq: Three times a day (TID) | ORAL | 0 refills | Status: AC

## 2020-01-29 NOTE — ED Provider Notes (Signed)
[facility] EMERGENCY DEPARTMENT Provider Note   CSN: [id number] Arrival date & time: 01/29/20  1115     History Chief Complaint  Patient presents with  . Dental Pain    Logan Lopez is a 64 y.o. male.  HPI 64 year old male with a history of hypertension, CHF presents to the ER with complaints of right-sided dental pain.  Patient reports that he has had several weeks of right upper dental pain, has overall poor dentition and "needs to get all of his teeth pulled".  He states he started having pain in his right upper jaw, which is now been shooting up into his sinuses and sometimes to the right side of his head.  He denies any vision changes, nausea, vomiting, fevers, chills.  No noticeable redness or swelling.  No noticeable pus drainage.  He has still been eating and drinking well.  Has not noticed any drooling or inability to swallow.    Past Medical History:  Diagnosis Date  . CHF (congestive heart failure) (HCC)    new 03/2019  . Dyspnea   . Hypertension     Patient Active Problem List   Diagnosis Date Noted  . Ventricular tachycardia (HCC) 05/04/2019  . CHF exacerbation (HCC) 03/10/2019  . Congestive heart failure (CHF) (HCC) 03/10/2019    Past Surgical History:  Procedure Laterality Date  . ICD IMPLANT N/A 09/24/2019   Procedure: ICD IMPLANT;  Surgeon: Regan Lemming, MD;  Location: [facility] INVASIVE CV LAB;  Service: Cardiovascular;  Laterality: N/A;  . RIGHT/LEFT HEART CATH AND CORONARY ANGIOGRAPHY N/A 03/12/2019   Procedure: RIGHT/LEFT HEART CATH AND CORONARY ANGIOGRAPHY;  Surgeon: Laurey Morale, MD;  Location: [facility] INVASIVE CV LAB;  Service: Cardiovascular;  Laterality: N/A;  . WISDOM TOOTH EXTRACTION         Family History  Problem Relation Age of Onset  . Hypertension Mother   . Kidney disease Mother     Social History   Tobacco Use  . Smoking status: Former Smoker    Years: 9.00    Types: Cigarettes  . Smokeless tobacco: Never Used    Vaping Use  . Vaping Use: Never used  Substance Use Topics  . Alcohol use: Not Currently    Comment: one day a week   . Drug use: Never    Home Medications Prior to Admission medications   Medication Sig Start Date End Date Taking? Authorizing Provider  acetaminophen (TYLENOL) 500 MG tablet Take 1,000 mg by mouth every 6 (six) hours as needed for mild pain.    [provider]  carvedilol (COREG) 6.25 MG tablet Take 1 tablet (6.25 mg total) by mouth 2 (two) times daily with a meal. 12/11/19   Laurey Morale, MD  clindamycin (CLEOCIN) 300 MG capsule Take 1 capsule (300 mg total) by mouth 3 (three) times daily for 7 days. 01/29/20 02/05/20  Mare Ferrari, PA-C  digoxin (LANOXIN) 0.125 MG tablet Take 1 tablet by mouth once daily 10/06/19   Laurey Morale, MD  ferrous sulfate 325 (65 FE) MG tablet Take 325 mg by mouth daily with breakfast.    [provider]  furosemide (LASIX) 40 MG tablet Take 1 tablet (40 mg total) by mouth as needed. 03/20/19   Clegg, Amy D, NP  hydrALAZINE (APRESOLINE) 25 MG tablet TAKE 1 TABLET(25 MG) BY MOUTH THREE TIMES DAILY 08/05/19   Laurey Morale, MD  isosorbide mononitrate (IMDUR) 30 MG 24 hr tablet Take 1 tablet by mouth once  daily 10/06/19   Laurey Morale, MD  sacubitril-valsartan (ENTRESTO) 49-51 MG Take 1 tablet by mouth 2 (two) times daily. 07/15/19   Laurey Morale, MD  spironolactone (ALDACTONE) 25 MG tablet Take 1 tablet by mouth once daily 12/08/19   Laurey Morale, MD  vitamin C (ASCORBIC ACID) 500 MG tablet Take 500 mg by mouth daily.    [provider]    Allergies    Penicillins  Review of Systems   Review of Systems  Constitutional: Negative for chills and fever.  HENT: Positive for dental problem and sinus pain. Negative for drooling, ear pain, sore throat, trouble swallowing and voice change.   Eyes: Negative for pain and visual disturbance.  Respiratory: Negative for shortness of breath.   Cardiovascular:  Negative for chest pain.    Physical Exam Updated Vital Signs BP (!) 115/58 (BP Location: Left Arm)   Pulse 73   Temp 98.9 F (37.2 C) (Oral)   Resp 18   Ht 6' (1.829 m)   Wt 71.2 kg   SpO2 99%   BMI 21.29 kg/m   Physical Exam Vitals reviewed.  Constitutional:      Appearance: Normal appearance.  HENT:     Head: Normocephalic and atraumatic.     Comments: No tenderness to palpation to the scalp or sinuses.  No overlying erythema, warmth to the face.  EOMs intact.    Mouth/Throat:     Mouth: Mucous membranes are moist.     Pharynx: Oropharynx is clear. No oropharyngeal exudate or posterior oropharyngeal erythema.     Comments: Overall poor dentition with all of the teeth on the roof of his mouth missing.  He has some evidence of tooth decay on his right upper molars with some mild tenderness to the gumline, however no visible erythema, swelling, fluctuance, drainage Oropharynx non erythematous without exudates, uvula midline, no unilateral tonsillar swelling, tongue normal size and midline, no sublingual/submandibular swellimg, tolerating secretions well    Eyes:     General:        Right eye: No discharge.        Left eye: No discharge.     Extraocular Movements: Extraocular movements intact.     Conjunctiva/sclera: Conjunctivae normal.     Pupils: Pupils are equal, round, and reactive to light.  Musculoskeletal:        General: No swelling. Normal range of motion.  Neurological:     General: No focal deficit present.     Mental Status: He is alert and oriented to person, place, and time.  Psychiatric:        Mood and Affect: Mood normal.        Behavior: Behavior normal.     ED Results / Procedures / Treatments   Labs (all labs ordered are listed, but only abnormal results are displayed) Labs Reviewed - No data to display  EKG None  Radiology No results found.  Procedures Procedures (including critical care time)  Medications Ordered in ED Medications -  No data to display  ED Course  I have reviewed the triage vital signs and the nursing notes.  Pertinent labs & imaging results that were available during my care of the patient were reviewed by me and considered in my medical decision making (see chart for details).    MDM Rules/Calculators/A&P                          Patient with toothache.  No gross abscess.  Exam unconcerning for Ludwig's angina or spread of infection.  Will treat with clindamycin as the patient states he is allergic to penicillin and anti-inflammatories medicine.  Urged patient to follow-up with dentist.    Final Clinical Impression(s) / ED Diagnoses Final diagnoses:  Pain, dental    Rx / DC Orders ED Discharge Orders         Ordered    clindamycin (CLEOCIN) 300 MG capsule  3 times daily        01/29/20 1412           Mare Ferrari, PA-C 01/29/20 1421    Sabas Sous, MD 01/29/20 1512

## 2020-01-29 NOTE — Discharge Instructions (Addendum)
Please take the antibiotic as prescribed until finished.  You may take Tylenol ibuprofen for pain.  Please return for any signs of infection including worsening pain, redness, swelling, drooling etc.  Please make sure to follow-up with a dentist as soon as possible.

## 2020-01-29 NOTE — ED Triage Notes (Signed)
Dental pain x 2 days. Sinus pressure this am.

## 2020-02-11 ENCOUNTER — Other Ambulatory Visit (HOSPITAL_COMMUNITY): Payer: Self-pay | Admitting: Cardiology

## 2020-03-05 ENCOUNTER — Ambulatory Visit (HOSPITAL_COMMUNITY)
Admission: RE | Admit: 2020-03-05 | Discharge: 2020-03-05 | Disposition: A | Discharge status: Home / Self Care | Payer: 59 | Source: Ambulatory Visit | Attending: Cardiology | Admitting: Cardiology

## 2020-03-05 ENCOUNTER — Encounter (HOSPITAL_COMMUNITY): Payer: Self-pay | Admitting: Cardiology

## 2020-03-05 ENCOUNTER — Other Ambulatory Visit: Payer: Self-pay

## 2020-03-05 VITALS — BP 104/60 | HR 62 | Wt 153.0 lb

## 2020-03-05 DIAGNOSIS — Z87891 Personal history of nicotine dependence: Secondary | ICD-10-CM | POA: Insufficient documentation

## 2020-03-05 DIAGNOSIS — I5042 Chronic combined systolic (congestive) and diastolic (congestive) heart failure: Secondary | ICD-10-CM | POA: Diagnosis not present

## 2020-03-05 DIAGNOSIS — I493 Ventricular premature depolarization: Secondary | ICD-10-CM | POA: Diagnosis present

## 2020-03-05 DIAGNOSIS — Z95810 Presence of automatic (implantable) cardiac defibrillator: Secondary | ICD-10-CM | POA: Insufficient documentation

## 2020-03-05 DIAGNOSIS — I428 Other cardiomyopathies: Secondary | ICD-10-CM | POA: Diagnosis not present

## 2020-03-05 DIAGNOSIS — I110 Hypertensive heart disease with heart failure: Secondary | ICD-10-CM | POA: Diagnosis not present

## 2020-03-05 DIAGNOSIS — I5022 Chronic systolic (congestive) heart failure: Secondary | ICD-10-CM | POA: Insufficient documentation

## 2020-03-05 LAB — BASIC METABOLIC PANEL
Anion gap: 8 (ref 5–15)
BUN: 9 mg/dL (ref 8–23)
CO2: 27 mmol/L (ref 22–32)
Calcium: 9.5 mg/dL (ref 8.9–10.3)
Chloride: 107 mmol/L (ref 98–111)
Creatinine, Ser: 0.96 mg/dL (ref 0.61–1.24)
GFR, Estimated: >60 mL/min (ref >60)
Glucose, Bld: 93 mg/dL (ref 70–99)
Potassium: 3.9 mmol/L (ref 3.5–5.1)
Sodium: 142 mmol/L (ref 135–145)

## 2020-03-05 LAB — DIGOXIN LEVEL: Digoxin Level: 0.8 ng/mL — ABNORMAL LOW (ref 1.0–2.0)

## 2020-03-05 MED ORDER — EMPAGLIFLOZIN 10 MG PO TABS: 10.0000 mg | ORAL_TABLET | Freq: Every day | ORAL | 3 refills | Status: DC

## 2020-03-05 NOTE — Patient Instructions (Addendum)
START Jardiance 10mg  (1 tablet) daily  Labs done today, your results will be available in MyChart, we will contact you for abnormal readings.  Your provider has recommended that  you wear a Zio Patch for 3 days.  This monitor will record your heart rhythm for our review.  IF you have any symptoms while wearing the monitor please press the button.  If you have any issues with the patch or you notice a red or orange light on it please call the company at [phone].  Once you remove the patch please mail it back to the company as soon as possible so we can get the results.  You have been referred to Cardiac Rehab. They will contact you to schedule an appointment  Your physician recommends that you schedule repeat labs in 10-14 days  Your physician recommends that you schedule a follow-up appointment in: 3 months  If you have any questions or concerns before your next appointment please send [phone] a message through [place] or call our office at [phone].    TO LEAVE A MESSAGE FOR THE NURSE SELECT OPTION 2, PLEASE LEAVE A MESSAGE INCLUDING: . YOUR NAME . DATE OF BIRTH . CALL BACK NUMBER . REASON FOR CALL**this is important as we prioritize the call backs  YOU WILL RECEIVE A CALL BACK THE SAME DAY AS LONG AS YOU CALL BEFORE 4:00 PM

## 2020-03-05 NOTE — Progress Notes (Signed)
Zio patch placed onto patient.  All instructions and information reviewed with patient, they verbalize understanding with no questions. 

## 2020-03-07 NOTE — Progress Notes (Signed)
PCP: [facility].  Primary Cardiologist: Dr Shirlee Latch   HPI: [name] is a 64 y.o. with a history of iron deficient anemia, former smoker, chronic systolic heart failure, and PVCs.   Admitted 03/2019 increased dyspnea. ECHO showed reduced EF 25-30%. LHC/RHC completed and showed low cardiac ouput/index and no significant coronary disease. While hospitalized he had high PVC burden so amiodarone was started. Discharged with [facility].   Echo in 2/21 showed EF 30-35%, diffuse hypokinesis, mildly decreased RV systolic function.  Zio patch monitor in 3/21 showed no significant arrhythmias.  He had Medtronic ICD placed in 5/21.   He returns for followup of CHF. Weight is stable today.  No significant exertional dyspnea.  No chest pain.  No orthopnea/PND.  Gets fatigued at times and feels like stamina is poor.   Medtronic device interrogation: fluid index > threshold, no VT/AF.   Labs (1/21): digoxin 0.8, TSH normal, K 4.1, creatinine 0.85, AST 47, ALT 51 Labs (4/21): LFTs, TSH normal Labs (5/21): K 4.5, creatinine 1.16 Labs (7/21): digoxin 0.8, K 4.1, creatinine 1.07  SH: Stopped drinking in 11/20. Worked at [employer] in the  [employer]. Lives with his wife. Active smoker.     PMH 1. Fe deficiency anemia.  2. Tobacco Abuse 3. Chronic systolic CHF: Nonischemic cardiomyopathy.  - 03/10/19 ECHO EF 25-30%  - RHC/LHC (11/20): mean RA 3, PA 41/21, mean PCWP 17, CI 1.9, PVR 3.75 WU; no significant coronary disease.  - Echo (2/21): EF 30-35%, diffuse hypokinesis, mildly decreased RV systolic function.  - Medtronic ICD - Cardiac MRI (9/21) was uninterpretable due to ICD artifact.  4. PVCs: frequent.   - Zio patch monitor in 3/21 on amiodarone showed no significant arrhythmias.  5. HTN  ROS: All systems negative except as listed in HPI, PMH and Problem List.  FH:  Family History  Problem Relation Age of Onset   Hypertension Mother    Kidney disease Mother      Current Outpatient Medications  Medication Sig Dispense Refill   acetaminophen (TYLENOL) 500 MG tablet Take 1,000 mg by mouth every 6 (six) hours as needed for mild pain.     carvedilol (COREG) 6.25 MG tablet Take 1 tablet (6.25 mg total) by mouth 2 (two) times daily with a meal. 180 tablet 3   digoxin (LANOXIN) 0.125 MG tablet Take 1 tablet by mouth once daily 90 tablet 3   ferrous sulfate 325 (65 FE) MG tablet Take 325 mg by mouth daily with breakfast.     furosemide (LASIX) 40 MG tablet Take 1 tablet (40 mg total) by mouth as needed. 30 tablet 0   hydrALAZINE (APRESOLINE) 25 MG tablet TAKE 1 TABLET BY MOUTH THREE TIMES DAILY 90 tablet 0   isosorbide mononitrate (IMDUR) 30 MG 24 hr tablet Take 1 tablet by mouth once daily 90 tablet 3   sacubitril-valsartan (ENTRESTO) 49-51 MG Take 1 tablet by mouth 2 (two) times daily. 180 tablet 3   spironolactone (ALDACTONE) 25 MG tablet Take 1 tablet by mouth once daily 90 tablet 3   vitamin C (ASCORBIC ACID) 500 MG tablet Take 500 mg by mouth daily.     empagliflozin (JARDIANCE) 10 MG TABS tablet Take 1 tablet (10 mg total) by mouth daily before breakfast. 30 tablet 3   No current facility-administered medications for this encounter.    Vitals:   03/05/20 1338  BP: 104/60  Pulse: 62  SpO2: 99%  Weight: 69.4 kg (153 lb)   Wt  Readings from Last 3 Encounters:  03/05/20 69.4 kg (153 lb)  01/29/20 71.2 kg (157 lb)  01/26/20 70.7 kg (155 lb 12.8 oz)    PHYSICAL EXAM: General: NAD Neck: No JVD, no thyromegaly or thyroid nodule.  Lungs: Clear to auscultation bilaterally with normal respiratory effort. CV: Nondisplaced PMI.  Heart regular S1/S2, no S3/S4, no murmur.  No peripheral edema.  No carotid bruit.  Normal pedal pulses.  Abdomen: Soft, nontender, no hepatosplenomegaly, no distention.  Skin: Intact without lesions or rashes.  Neurologic: Alert and oriented x 3.  Psych: Normal affect. Extremities: Clubbing of fingers.    HEENT: Normal.   ASSESSMENT & PLAN: 1. Chronic Systolic CHF: Nonischemic cardiomyopathy, echo 03/10/19 with EF 25-30%, RV ok. No family history of cardiomyopathy. No significant coronary disease on cath in 11/20. No definite pre-existing viral symptoms. It is possible that this could be a PVC-mediated CMP given frequent PVCs at the time of diagnosis (or that the cardiomyopathy itself could be driving the PVCs). RHC in 11/20 showed low cardiac index at 1.8. CT chest in 11/20 with no evidence for sarcoid. Echo in 2/21 showed that EF remains 25-30%. MDT ICD placed in 5/21.  NYHA class II symptoms, he is not volume overloaded on exam though fluid index > threshold today on Medtronic ICD interrogation.   - Start Farxiga 10 mg daily, this will give some gentle diuresis.  BMET today and 10 days.  - Continue Coreg 6.25 mg bid. Will hold off on increasing due to his generalized fatigue.  - Continue digoxin 0.125 daily, check level today.  - Continue hydralazine  25 mg three times a day + 30 mg Imdur daily.  - Continue spiro to 25 mg daily. - Continue Entresto 49/51 bid, does not have BP room to titrate up today.   - I will arrange for cardiac rehab, he would like to do this as the hospital in [facility].  2. PVCs/NSVT: Very frequent when initially diagnosed in 11/20. ?Cause of cardiomyopathy versus result of cardiomyopathy. Decreased PVCs on amiodarone => Zio patch on amiodarone 100 mg daily showed no arrhythmias.  He now has a MDT ICD.  He is now off amiodarone.  - Repeat Zio patch x 3 days off amiodarone to make sure PVC percentage is not climbing again.  3. Smoking: No longer smoking   Followup in 3 months.   Marca Ancona 03/07/2020

## 2020-03-15 ENCOUNTER — Other Ambulatory Visit (HOSPITAL_COMMUNITY): Payer: Self-pay | Admitting: Cardiology

## 2020-03-16 ENCOUNTER — Other Ambulatory Visit: Payer: Self-pay

## 2020-03-16 ENCOUNTER — Ambulatory Visit (HOSPITAL_COMMUNITY)
Admission: RE | Admit: 2020-03-16 | Discharge: 2020-03-16 | Disposition: A | Discharge status: Home / Self Care | Payer: 59 | Source: Ambulatory Visit | Attending: Internal Medicine | Admitting: Internal Medicine

## 2020-03-24 ENCOUNTER — Ambulatory Visit (INDEPENDENT_AMBULATORY_CARE_PROVIDER_SITE_OTHER): Payer: 59

## 2020-03-24 DIAGNOSIS — I428 Other cardiomyopathies: Secondary | ICD-10-CM | POA: Diagnosis not present

## 2020-03-31 LAB — CUP PACEART REMOTE DEVICE CHECK
Battery Remaining Longevity: 123 mo
Battery Voltage: 3.02 V
Brady Statistic AP VP Percent: 0.03 %
Brady Statistic AP VS Percent: 38.25 %
Brady Statistic AS VP Percent: 0.04 %
Brady Statistic AS VS Percent: 61.68 %
Brady Statistic RA Percent Paced: 36.66 %
Brady Statistic RV Percent Paced: 0.06 %
Date Time Interrogation Session: 20211129152005
HighPow Impedance: 75 Ohm
Implantable Lead Implant Date: 20210526
Implantable Lead Implant Date: 20210526
Implantable Lead Location: 753859
Implantable Lead Location: 753860
Implantable Lead Model: 5076
Implantable Pulse Generator Implant Date: 20210526
Lead Channel Impedance Value: 456 Ohm
Lead Channel Impedance Value: 456 Ohm
Lead Channel Impedance Value: 589 Ohm
Lead Channel Pacing Threshold Amplitude: 0.625 V
Lead Channel Pacing Threshold Amplitude: 0.625 V
Lead Channel Pacing Threshold Pulse Width: 0.4 ms
Lead Channel Pacing Threshold Pulse Width: 0.4 ms
Lead Channel Sensing Intrinsic Amplitude: 4.375 mV
Lead Channel Sensing Intrinsic Amplitude: 4.375 mV
Lead Channel Sensing Intrinsic Amplitude: 6.5 mV
Lead Channel Sensing Intrinsic Amplitude: 6.5 mV
Lead Channel Setting Pacing Amplitude: 1.5 V
Lead Channel Setting Pacing Amplitude: 2.5 V
Lead Channel Setting Pacing Pulse Width: 0.4 ms
Lead Channel Setting Sensing Sensitivity: 0.3 mV

## 2020-03-31 NOTE — Addendum Note (Signed)
Encounter addended by: Crissie Figures, RN on: 03/31/2020 1:19 PM  Actions taken: Imaging Exam ended

## 2020-04-02 NOTE — Progress Notes (Signed)
Remote ICD transmission.   

## 2020-04-05 ENCOUNTER — Telehealth (HOSPITAL_COMMUNITY): Payer: Self-pay | Admitting: Pharmacy Technician

## 2020-04-05 ENCOUNTER — Other Ambulatory Visit (HOSPITAL_COMMUNITY): Payer: Self-pay

## 2020-04-05 ENCOUNTER — Telehealth (HOSPITAL_COMMUNITY): Payer: Self-pay

## 2020-04-05 MED ORDER — AMIODARONE HCL 200 MG PO TABS
100.0000 mg | ORAL_TABLET | Freq: Every day | ORAL | 3 refills | Status: DC
Start: 2020-04-05 — End: 2020-04-07

## 2020-04-05 MED ORDER — AMIODARONE HCL 100 MG PO TABS: 100.0000 mg | ORAL_TABLET | Freq: Every day | ORAL | 3 refills | Status: DC

## 2020-04-05 NOTE — Telephone Encounter (Signed)
Insurance requires a PA on Amiodarone 100mg , his current RX needs to be updated to 200mg .   Asked CMA to send updated script, called and informed patient that we could change the dosage and insurance should approve.  , CPhT

## 2020-04-05 NOTE — Telephone Encounter (Signed)
Patients wife advised and verbalized understanding. Rx sent into patients pharmacy on file.  Meds ordered this encounter  Medications  . amiodarone (PACERONE) 100 MG tablet    Sig: Take 1 tablet (100 mg total) by mouth daily.    Dispense:  90 tablet    Refill:  3

## 2020-04-05 NOTE — Telephone Encounter (Signed)
-----   Message from Laurey Morale, MD sent at 04/04/2020 10:16 PM EST ----- PVC percentage back up to 9.7% off amiodarone. Think he should restart amiodarone 100 mg daily.

## 2020-04-07 ENCOUNTER — Other Ambulatory Visit (HOSPITAL_COMMUNITY): Payer: Self-pay

## 2020-04-07 MED ORDER — AMIODARONE HCL 200 MG PO TABS
100.0000 mg | ORAL_TABLET | Freq: Every day | ORAL | 3 refills | Status: DC
Start: 2020-04-07 — End: 2021-05-09

## 2020-04-12 ENCOUNTER — Other Ambulatory Visit (HOSPITAL_COMMUNITY): Payer: Self-pay | Admitting: Cardiology

## 2020-05-15 ENCOUNTER — Other Ambulatory Visit (HOSPITAL_COMMUNITY): Payer: Self-pay | Admitting: Cardiology

## 2020-06-11 ENCOUNTER — Telehealth (HOSPITAL_COMMUNITY): Payer: Self-pay | Admitting: Pharmacy Technician

## 2020-06-11 ENCOUNTER — Other Ambulatory Visit: Payer: Self-pay

## 2020-06-11 ENCOUNTER — Ambulatory Visit (HOSPITAL_COMMUNITY)
Admission: RE | Admit: 2020-06-11 | Discharge: 2020-06-11 | Disposition: A | Discharge status: Home / Self Care | Payer: 59 | Source: Ambulatory Visit | Attending: Cardiology | Admitting: Cardiology

## 2020-06-11 ENCOUNTER — Encounter (HOSPITAL_COMMUNITY): Payer: Self-pay | Admitting: Cardiology

## 2020-06-11 VITALS — BP 98/60 | Wt 151.0 lb

## 2020-06-11 DIAGNOSIS — I493 Ventricular premature depolarization: Secondary | ICD-10-CM

## 2020-06-11 DIAGNOSIS — Z87891 Personal history of nicotine dependence: Secondary | ICD-10-CM | POA: Insufficient documentation

## 2020-06-11 DIAGNOSIS — I110 Hypertensive heart disease with heart failure: Secondary | ICD-10-CM | POA: Diagnosis not present

## 2020-06-11 DIAGNOSIS — Z8249 Family history of ischemic heart disease and other diseases of the circulatory system: Secondary | ICD-10-CM | POA: Diagnosis not present

## 2020-06-11 DIAGNOSIS — Z79899 Other long term (current) drug therapy: Secondary | ICD-10-CM | POA: Insufficient documentation

## 2020-06-11 DIAGNOSIS — I5022 Chronic systolic (congestive) heart failure: Secondary | ICD-10-CM | POA: Insufficient documentation

## 2020-06-11 DIAGNOSIS — Z95810 Presence of automatic (implantable) cardiac defibrillator: Secondary | ICD-10-CM | POA: Insufficient documentation

## 2020-06-11 DIAGNOSIS — I428 Other cardiomyopathies: Secondary | ICD-10-CM | POA: Insufficient documentation

## 2020-06-11 LAB — COMPREHENSIVE METABOLIC PANEL
ALT: 31 U/L (ref 0–44)
AST: 31 U/L (ref 15–41)
Albumin: 3.8 g/dL (ref 3.5–5.0)
Alkaline Phosphatase: 92 U/L (ref 38–126)
Anion gap: 10 (ref 5–15)
BUN: 9 mg/dL (ref 8–23)
CO2: 23 mmol/L (ref 22–32)
Calcium: 9.5 mg/dL (ref 8.9–10.3)
Chloride: 104 mmol/L (ref 98–111)
Creatinine, Ser: 0.91 mg/dL (ref 0.61–1.24)
GFR, Estimated: >60 mL/min (ref >60)
Glucose, Bld: 107 mg/dL — ABNORMAL HIGH (ref 70–99)
Potassium: 4.1 mmol/L (ref 3.5–5.1)
Sodium: 137 mmol/L (ref 135–145)
Total Bilirubin: 0.8 mg/dL (ref 0.3–1.2)
Total Protein: 7.8 g/dL (ref 6.5–8.1)

## 2020-06-11 LAB — TSH: TSH: 0.632 u[IU]/mL (ref 0.350–4.500)

## 2020-06-11 LAB — DIGOXIN LEVEL: Digoxin Level: 1.1 ng/mL (ref 0.8–2.0)

## 2020-06-11 MED ORDER — DAPAGLIFLOZIN PROPANEDIOL 10 MG PO TABS: 10.0000 mg | ORAL_TABLET | Freq: Every day | ORAL | 3 refills | Status: DC

## 2020-06-11 NOTE — Patient Instructions (Addendum)
Labs done today. We will contact you only if your labs are abnormal.  START Logan Lopez 10mg  (1 tablet) by mouth daily.  No other medication changes were made. Please continue all current medications as prescribed.  Your physician recommends that you schedule a follow-up appointment in: 3 months with an echo prior to your appointment.  Your physician has requested that you have an echocardiogram. Echocardiography is a painless test that uses sound waves to create images of your heart. It provides your doctor with information about the size and shape of your heart and how well your heart's chambers and valves are working. This procedure takes approximately one hour. There are no restrictions for this procedure.  If you have any questions or concerns before your next appointment please send a message through [place] or call our office at [phone].    TO LEAVE A MESSAGE FOR THE NURSE SELECT OPTION 2, PLEASE LEAVE A MESSAGE INCLUDING: . YOUR NAME . DATE OF BIRTH . CALL BACK NUMBER . REASON FOR CALL**this is important as we prioritize the call backs  YOU WILL RECEIVE A CALL BACK THE SAME DAY AS LONG AS YOU CALL BEFORE 4:00 PM   Do the following things EVERYDAY: 1) Weigh yourself in the morning before breakfast. Write it down and keep it in a log. 2) Take your medicines as prescribed 3) Eat low salt foods-Limit salt (sodium) to 2000 mg per day.  4) Stay as active as you can everyday 5) Limit all fluids for the day to less than 2 liters   At the [facility], you and your health needs are our priority. As part of our continuing mission to provide you with exceptional heart care, we have created designated Provider Care Teams. These Care Teams include your primary Cardiologist (physician) and Advanced Practice Providers (APPs- Physician Assistants and Nurse Practitioners) who all work together to provide you with the care you need, when you need it.   You may see any of the  following providers on your designated Care Team at your next follow up: [phone] Dr Marland Kitchen . Dr Arvilla Meres . Marca Ancona, NP . Tonye Becket, PA . Robbie Lis, PharmD   Please be sure to bring in all your medications bottles to every appointment.

## 2020-06-11 NOTE — Telephone Encounter (Signed)
Patient was seen in clinic today and stated that his Logan Lopez was unaffordable after the co-pay card was also used.   Upon investigation, it is much cheaper for him to get [place] than Jardiance.  Activated and provided the patient a co-pay card for [place]. He is aware a 90 day supply will be $0. Advised him to call me with any issues.  Asked Philicia (CMA) to send a 90 day RX instead of 30 for the most savings.  Archer Asa, CPhT

## 2020-06-13 NOTE — Progress Notes (Signed)
PCP: [facility].  Primary Cardiologist: Dr Shirlee Latch   HPI: Logan Lopez is a 65 y.o. with a history of iron deficient anemia, former smoker, chronic systolic heart failure, and PVCs.   Admitted 03/2019 increased dyspnea. ECHO showed reduced EF 25-30%. LHC/RHC completed and showed low cardiac ouput/index and no significant coronary disease. While hospitalized he had high PVC burden so amiodarone was started. Discharged with [facility].   Echo in 2/21 showed EF 30-35%, diffuse hypokinesis, mildly decreased RV systolic function.  Zio patch monitor in 3/21 showed no significant arrhythmias.  He had Medtronic ICD placed in 5/21.   Zio patch in 12/21 off amiodarone showed 9.7% PVCs, amiodarone was restarted.   He returns for followup of CHF. Weight down 2 lbs.  No significant exertional dyspnea though he feels like he does not have much energy/easily fatigues.  No lightheadedness.  No chest pain.  Occasional palpitations.  No orthopnea/PND.  No snoring or daytime sleepiness.   Medtronic device interrogation: fluid index < threshold, no AT/AF, no VT.    ECG (personally reviewed): NSR, normal  Labs (1/21): digoxin 0.8, TSH normal, K 4.1, creatinine 0.85, AST 47, ALT 51 Labs (4/21): LFTs, TSH normal Labs (5/21): K 4.5, creatinine 1.16 Labs (7/21): digoxin 0.8, K 4.1, creatinine 1.07 Labs (11/21): digoxin 0.8, K 3.9, creatinine 0.96  SH: Stopped drinking in 11/20. Worked at [employer] in the  [employer]. Lives with his wife. Has quit smoking.      PMH 1. Fe deficiency anemia.  2. Tobacco Abuse 3. Chronic systolic CHF: Nonischemic cardiomyopathy.  - 03/10/19 ECHO EF 25-30%  - RHC/LHC (11/20): mean RA 3, PA 41/21, mean PCWP 17, CI 1.9, PVR 3.75 WU; no significant coronary disease.  - Echo (2/21): EF 30-35%, diffuse hypokinesis, mildly decreased RV systolic function.  - Medtronic ICD - Cardiac MRI (9/21) was uninterpretable due to ICD artifact.  4. PVCs: frequent.   -  Zio patch monitor in 3/21 on amiodarone showed no significant arrhythmias.  - Zio patch monitor 12/21 with 9.7% PVCs (off amiodarone).  5. HTN  ROS: All systems negative except as listed in HPI, PMH and Problem List.  FH:  Family History  Problem Relation Age of Onset  . Hypertension Mother   . Kidney disease Mother     Current Outpatient Medications  Medication Sig Dispense Refill  . acetaminophen (TYLENOL) 500 MG tablet Take 1,000 mg by mouth every 6 (six) hours as needed for mild pain.    Marland Kitchen amiodarone (PACERONE) 200 MG tablet Take 0.5 tablets (100 mg total) by mouth daily. 45 tablet 3  . carvedilol (COREG) 6.25 MG tablet Take 1 tablet (6.25 mg total) by mouth 2 (two) times daily with a meal. 180 tablet 3  . dapagliflozin propanediol (FARXIGA) 10 MG TABS tablet Take 1 tablet (10 mg total) by mouth daily before breakfast. 90 tablet 3  . digoxin (LANOXIN) 0.125 MG tablet Take 1 tablet by mouth once daily 90 tablet 3  . ferrous sulfate 325 (65 FE) MG tablet Take 325 mg by mouth daily with breakfast.    . furosemide (LASIX) 40 MG tablet Take 1 tablet (40 mg total) by mouth as needed. 30 tablet 0  . hydrALAZINE (APRESOLINE) 25 MG tablet TAKE 1 TABLET BY MOUTH THREE TIMES DAILY 90 tablet 3  . isosorbide mononitrate (IMDUR) 30 MG 24 hr tablet Take 1 tablet by mouth once daily 90 tablet 3  . sacubitril-valsartan (ENTRESTO) 49-51 MG Take 1 tablet by mouth  2 (two) times daily. 180 tablet 3  . spironolactone (ALDACTONE) 25 MG tablet Take 1 tablet by mouth once daily 90 tablet 3  . vitamin C (ASCORBIC ACID) 500 MG tablet Take 500 mg by mouth daily.     No current facility-administered medications for this encounter.    Vitals:   06/11/20 1422  BP: 98/60  SpO2: 99%  Weight: 68.5 kg (151 lb)   Wt Readings from Last 3 Encounters:  06/11/20 68.5 kg (151 lb)  03/05/20 69.4 kg (153 lb)  01/29/20 71.2 kg (157 lb)    PHYSICAL EXAM: General: NAD Neck: No JVD, no thyromegaly or thyroid  nodule.  Lungs: Clear to auscultation bilaterally with normal respiratory effort. CV: Nondisplaced PMI.  Heart regular S1/S2, no S3/S4, no murmur.  No peripheral edema.  No carotid bruit.  Normal pedal pulses.  Abdomen: Soft, nontender, no hepatosplenomegaly, no distention.  Skin: Intact without lesions or rashes.  Neurologic: Alert and oriented x 3.  Psych: Normal affect. Extremities: No clubbing or cyanosis.  HEENT: Normal.   ASSESSMENT & PLAN: 1. Chronic Systolic CHF: Nonischemic cardiomyopathy, echo 03/10/19 with EF 25-30%, RV ok. No family history of cardiomyopathy. No significant coronary disease on cath in 11/20. No definite pre-existing viral symptoms. It is possible that this could be a PVC-mediated CMP given frequent PVCs at the time of diagnosis (or that the cardiomyopathy itself could be driving the PVCs). RHC in 11/20 showed low cardiac index at 1.8. CT chest in 11/20 with no evidence for sarcoid. Echo in 2/21 showed that EF remains 25-30%. MDT ICD placed in 5/21.  NYHA class II symptoms, he is not volume overloaded on exam or by Optivol. - Start on Jardiance 10 mg daily today.  BMET today and in 10 days.   - Continue Coreg 6.25 mg bid. Will hold off on increasing due to his generalized fatigue and soft BP.  - Continue digoxin 0.125 daily, check level today.  - Continue hydralazine  25 mg three times a day + 30 mg Imdur daily.  - Continue spiro to 25 mg daily. - Continue Entresto 49/51 bid, does not have BP room to titrate up today.   - I will try again to arrange cardiac rehab at [facility].  - Echo at followup in 3 months.  2. PVCs/NSVT: Very frequent when initially diagnosed in 11/20. ?Cause of cardiomyopathy versus result of cardiomyopathy. Decreased PVCs on amiodarone => Zio patch on amiodarone 100 mg daily showed no arrhythmias.  He now has a MDT ICD.  Amiodarone was stopped and 12/21 Zio patch was done, showing PVCs 9.7%.  Amiodarone was restarted.   - Continue  amiodarone 100 mg daily, check LFTs and TSH today.  He will need a regular eye exam.  3. Smoking: No longer smoking   Followup in 3 months witih echo.   Marca Ancona 06/13/2020

## 2020-06-21 ENCOUNTER — Other Ambulatory Visit: Payer: Self-pay

## 2020-06-21 ENCOUNTER — Ambulatory Visit (HOSPITAL_COMMUNITY)
Admission: RE | Admit: 2020-06-21 | Discharge: 2020-06-21 | Disposition: A | Discharge status: Home / Self Care | Payer: 59 | Source: Ambulatory Visit | Attending: Cardiology | Admitting: Cardiology

## 2020-06-21 DIAGNOSIS — I5042 Chronic combined systolic (congestive) and diastolic (congestive) heart failure: Secondary | ICD-10-CM | POA: Insufficient documentation

## 2020-06-21 LAB — DIGOXIN LEVEL: Digoxin Level: 0.6 ng/mL — ABNORMAL LOW (ref 0.8–2.0)

## 2020-06-23 ENCOUNTER — Ambulatory Visit (INDEPENDENT_AMBULATORY_CARE_PROVIDER_SITE_OTHER): Payer: 59

## 2020-06-23 DIAGNOSIS — I472 Ventricular tachycardia, unspecified: Secondary | ICD-10-CM

## 2020-06-24 LAB — CUP PACEART REMOTE DEVICE CHECK
Battery Remaining Longevity: 121 mo
Battery Voltage: 3.04 V
Brady Statistic AP VP Percent: 0.02 %
Brady Statistic AP VS Percent: 70.54 %
Brady Statistic AS VP Percent: 0.02 %
Brady Statistic AS VS Percent: 29.43 %
Brady Statistic RA Percent Paced: 70.55 %
Brady Statistic RV Percent Paced: 0.03 %
Date Time Interrogation Session: 20220223033324
HighPow Impedance: 76 Ohm
Implantable Lead Implant Date: 20210526
Implantable Lead Implant Date: 20210526
Implantable Lead Location: 753859
Implantable Lead Location: 753860
Implantable Lead Model: 5076
Implantable Pulse Generator Implant Date: 20210526
Lead Channel Impedance Value: 456 Ohm
Lead Channel Impedance Value: 456 Ohm
Lead Channel Impedance Value: 570 Ohm
Lead Channel Pacing Threshold Amplitude: 0.5 V
Lead Channel Pacing Threshold Amplitude: 0.625 V
Lead Channel Pacing Threshold Pulse Width: 0.4 ms
Lead Channel Pacing Threshold Pulse Width: 0.4 ms
Lead Channel Sensing Intrinsic Amplitude: 4 mV
Lead Channel Sensing Intrinsic Amplitude: 4 mV
Lead Channel Sensing Intrinsic Amplitude: 6.5 mV
Lead Channel Sensing Intrinsic Amplitude: 6.5 mV
Lead Channel Setting Pacing Amplitude: 1.5 V
Lead Channel Setting Pacing Amplitude: 2.5 V
Lead Channel Setting Pacing Pulse Width: 0.4 ms
Lead Channel Setting Sensing Sensitivity: 0.3 mV

## 2020-07-01 NOTE — Progress Notes (Signed)
Remote ICD transmission.   

## 2020-08-10 ENCOUNTER — Other Ambulatory Visit (HOSPITAL_COMMUNITY): Payer: Self-pay | Admitting: Cardiology

## 2020-08-12 ENCOUNTER — Other Ambulatory Visit (HOSPITAL_COMMUNITY): Payer: Self-pay

## 2020-09-14 ENCOUNTER — Encounter (HOSPITAL_COMMUNITY): Payer: Self-pay | Admitting: Cardiology

## 2020-09-14 ENCOUNTER — Other Ambulatory Visit: Payer: Self-pay

## 2020-09-14 ENCOUNTER — Other Ambulatory Visit (HOSPITAL_COMMUNITY): Payer: Self-pay

## 2020-09-14 ENCOUNTER — Ambulatory Visit (HOSPITAL_COMMUNITY)
Admission: RE | Admit: 2020-09-14 | Discharge: 2020-09-14 | Disposition: A | Discharge status: Home / Self Care | Payer: 59 | Source: Ambulatory Visit | Attending: Cardiology | Admitting: Cardiology

## 2020-09-14 ENCOUNTER — Ambulatory Visit (HOSPITAL_BASED_OUTPATIENT_CLINIC_OR_DEPARTMENT_OTHER)
Admission: RE | Admit: 2020-09-14 | Discharge: 2020-09-14 | Disposition: A | Discharge status: Home / Self Care | Payer: 59 | Source: Ambulatory Visit | Attending: Cardiology | Admitting: Cardiology

## 2020-09-14 ENCOUNTER — Other Ambulatory Visit (HOSPITAL_COMMUNITY): Payer: Self-pay | Admitting: Cardiology

## 2020-09-14 VITALS — BP 100/50 | HR 59 | Wt 149.4 lb

## 2020-09-14 DIAGNOSIS — I351 Nonrheumatic aortic (valve) insufficiency: Secondary | ICD-10-CM | POA: Diagnosis not present

## 2020-09-14 DIAGNOSIS — Z7984 Long term (current) use of oral hypoglycemic drugs: Secondary | ICD-10-CM | POA: Diagnosis not present

## 2020-09-14 DIAGNOSIS — Z87891 Personal history of nicotine dependence: Secondary | ICD-10-CM | POA: Diagnosis not present

## 2020-09-14 DIAGNOSIS — Z79899 Other long term (current) drug therapy: Secondary | ICD-10-CM | POA: Diagnosis not present

## 2020-09-14 DIAGNOSIS — I110 Hypertensive heart disease with heart failure: Secondary | ICD-10-CM | POA: Diagnosis not present

## 2020-09-14 DIAGNOSIS — N529 Male erectile dysfunction, unspecified: Secondary | ICD-10-CM | POA: Insufficient documentation

## 2020-09-14 DIAGNOSIS — I5022 Chronic systolic (congestive) heart failure: Secondary | ICD-10-CM | POA: Insufficient documentation

## 2020-09-14 DIAGNOSIS — Z95810 Presence of automatic (implantable) cardiac defibrillator: Secondary | ICD-10-CM | POA: Insufficient documentation

## 2020-09-14 DIAGNOSIS — I493 Ventricular premature depolarization: Secondary | ICD-10-CM | POA: Diagnosis not present

## 2020-09-14 DIAGNOSIS — I428 Other cardiomyopathies: Secondary | ICD-10-CM | POA: Diagnosis not present

## 2020-09-14 DIAGNOSIS — Z8249 Family history of ischemic heart disease and other diseases of the circulatory system: Secondary | ICD-10-CM | POA: Diagnosis not present

## 2020-09-14 LAB — ECHOCARDIOGRAM COMPLETE
Area-P 1/2: 2.06 cm2
Calc EF: 48.6 %
S' Lateral: 3.2 cm
Single Plane A2C EF: 43.9 %
Single Plane A4C EF: 49.2 %

## 2020-09-14 LAB — COMPREHENSIVE METABOLIC PANEL
ALT: 24 U/L (ref 0–44)
AST: 39 U/L (ref 15–41)
Albumin: 3.9 g/dL (ref 3.5–5.0)
Alkaline Phosphatase: 85 U/L (ref 38–126)
Anion gap: 11 (ref 5–15)
BUN: 10 mg/dL (ref 8–23)
CO2: 26 mmol/L (ref 22–32)
Calcium: 9.8 mg/dL (ref 8.9–10.3)
Chloride: 99 mmol/L (ref 98–111)
Creatinine, Ser: 1.07 mg/dL (ref 0.61–1.24)
GFR, Estimated: >60 mL/min (ref >60)
Glucose, Bld: 92 mg/dL (ref 70–99)
Potassium: 5.5 mmol/L — ABNORMAL HIGH (ref 3.5–5.1)
Sodium: 136 mmol/L (ref 135–145)
Total Bilirubin: 0.9 mg/dL (ref 0.3–1.2)
Total Protein: 7.6 g/dL (ref 6.5–8.1)

## 2020-09-14 LAB — TSH: TSH: 0.654 u[IU]/mL (ref 0.350–4.500)

## 2020-09-14 LAB — DIGOXIN LEVEL: Digoxin Level: 1 ng/mL (ref 0.8–2.0)

## 2020-09-14 MED ORDER — SILDENAFIL CITRATE 50 MG PO TABS: 50.0000 mg | ORAL_TABLET | Freq: Every day | ORAL | 0 refills | Status: DC | PRN

## 2020-09-14 NOTE — Patient Instructions (Signed)
We have sent in a prescription for Viagra to use AS NEEDED, make sure when you are going to take this that you DO NOT take Imdur that day or the next day  Labs done today, your results will be available in MyChart, we will contact you for abnormal readings.  Your physician recommends that you return for lab work in: 3 months  Please call our office in October to schedule your follow up appointment  If you have any questions or concerns before your next appointment please send [place] a message through [place] or call our office at [phone].    TO LEAVE A MESSAGE FOR THE NURSE SELECT OPTION 2, PLEASE LEAVE A MESSAGE INCLUDING: . YOUR NAME . DATE OF BIRTH . CALL BACK NUMBER . REASON FOR CALL**this is important as we prioritize the call backs  YOU WILL RECEIVE A CALL BACK THE SAME DAY AS LONG AS YOU CALL BEFORE 4:00 PM  At the [facility], you and your health needs are our priority. As part of our continuing mission to provide you with exceptional heart care, we have created designated Provider Care Teams. These Care Teams include your primary Cardiologist (physician) and Advanced Practice Providers (APPs- Physician Assistants and Nurse Practitioners) who all work together to provide you with the care you need, when you need it.   You may see any of the following providers on your designated Care Team at your next follow up: Marland Kitchen Dr Arvilla Meres . Dr Marca Ancona . Dr Thornell Mule . Tonye Becket, NP . Robbie Lis, PA . Shanda Bumps [place],NP . Karle Plumber, PharmD   Please be sure to bring in all your medications bottles to every appointment.

## 2020-09-15 ENCOUNTER — Other Ambulatory Visit (HOSPITAL_COMMUNITY): Payer: Self-pay

## 2020-09-15 NOTE — Progress Notes (Signed)
PCP: [facility].  Primary Cardiologist: Dr Shirlee Latch   HPI: [name] is a 65 y.o. with a history of iron deficient anemia, former smoker, chronic systolic heart failure, and PVCs.   Admitted 03/2019 increased dyspnea. ECHO showed reduced EF 25-30%. LHC/RHC completed and showed low cardiac ouput/index and no significant coronary disease. While hospitalized he had high PVC burden so amiodarone was started. Discharged with [employer].   Echo in 2/21 showed EF 30-35%, diffuse hypokinesis, mildly decreased RV systolic function.  Zio patch monitor in 3/21 showed no significant arrhythmias.  He had Medtronic ICD placed in 5/21.   Zio patch in 12/21 off amiodarone showed 9.7% PVCs, amiodarone was restarted.   Echo done today and reviewed showed EF 45% with normal RV.   He returns for followup of CHF. Weight down 2 lbs.  No exertional dyspnea or chest pain.  Able to do what he wants.  Complains about erectile dysfunction.  No orthopnea/PND.  No lightheadedness.   Medtronic device interrogation: fluid index < threshold, no AF/VT.    Labs (1/21): digoxin 0.8, TSH normal, K 4.1, creatinine 0.85, AST 47, ALT 51 Labs (4/21): LFTs, TSH normal Labs (5/21): K 4.5, creatinine 1.16 Labs (7/21): digoxin 0.8, K 4.1, creatinine 1.07 Labs (11/21): digoxin 0.8, K 3.9, creatinine 0.96 Labs (2/22): digoxin 0.6, TSH normal, K 4.1, creatinine 0.91, LFTs normal  SH: Stopped drinking in 11/20. Worked at [employer] in the  [employer]. Lives with his wife. Has quit smoking.      PMH 1. Fe deficiency anemia.  2. Tobacco Abuse 3. Chronic systolic CHF: Nonischemic cardiomyopathy.  - 03/10/19 ECHO EF 25-30%  - RHC/LHC (11/20): mean RA 3, PA 41/21, mean PCWP 17, CI 1.9, PVR 3.75 WU; no significant coronary disease.  - Echo (2/21): EF 30-35%, diffuse hypokinesis, mildly decreased RV systolic function.  - Medtronic ICD - Cardiac MRI (9/21) was uninterpretable due to ICD artifact.  - Echo  (5/22): EF 45% with normal RV. 4. PVCs: frequent.   - Zio patch monitor in 3/21 on amiodarone showed no significant arrhythmias.  - Zio patch monitor 12/21 with 9.7% PVCs (off amiodarone).  5. HTN  ROS: All systems negative except as listed in HPI, PMH and Problem List.  FH:  Family History  Problem Relation Age of Onset  . Hypertension Mother   . Kidney disease Mother     Current Outpatient Medications  Medication Sig Dispense Refill  . acetaminophen (TYLENOL) 500 MG tablet Take 1,000 mg by mouth every 6 (six) hours as needed for mild pain.    Marland Kitchen amiodarone (PACERONE) 200 MG tablet Take 0.5 tablets (100 mg total) by mouth daily. 45 tablet 3  . carvedilol (COREG) 6.25 MG tablet Take 1 tablet (6.25 mg total) by mouth 2 (two) times daily with a meal. 180 tablet 3  . dapagliflozin propanediol (FARXIGA) 10 MG TABS tablet Take 1 tablet (10 mg total) by mouth daily before breakfast. 90 tablet 3  . digoxin (LANOXIN) 0.125 MG tablet Take 1 tablet by mouth once daily 90 tablet 3  . ENTRESTO 49-51 MG Take 1 tablet by mouth twice daily 120 tablet 0  . ferrous sulfate 325 (65 FE) MG tablet Take 325 mg by mouth daily with breakfast.    . furosemide (LASIX) 40 MG tablet Take 1 tablet (40 mg total) by mouth as needed. 30 tablet 0  . hydrALAZINE (APRESOLINE) 25 MG tablet TAKE 1 TABLET BY MOUTH THREE TIMES DAILY 90 tablet 3  .  isosorbide mononitrate (IMDUR) 30 MG 24 hr tablet Take 1 tablet by mouth once daily 90 tablet 3  . sildenafil (VIAGRA) 50 MG tablet Take 1 tablet (50 mg total) by mouth daily as needed for erectile dysfunction. DO NOT TAKE IMDUR ON DAYS YOU TAKE VIAGRA OR NEXT DAY 10 tablet 0  . spironolactone (ALDACTONE) 25 MG tablet Take 1 tablet by mouth once daily 90 tablet 3  . vitamin C (ASCORBIC ACID) 500 MG tablet Take 500 mg by mouth daily.     No current facility-administered medications for this encounter.    Vitals:   09/14/20 1410  BP: (!) 100/50  Pulse: (!) 59  SpO2: 99%   Weight: 67.8 kg (149 lb 6.4 oz)   Wt Readings from Last 3 Encounters:  09/14/20 67.8 kg (149 lb 6.4 oz)  06/11/20 68.5 kg (151 lb)  03/05/20 69.4 kg (153 lb)    PHYSICAL EXAM: General: NAD Neck: No JVD, no thyromegaly or thyroid nodule.  Lungs: Clear to auscultation bilaterally with normal respiratory effort. CV: Nondisplaced PMI.  Heart regular S1/S2, no S3/S4, no murmur.  No peripheral edema.  No carotid bruit.  Normal pedal pulses.  Abdomen: Soft, nontender, no hepatosplenomegaly, no distention.  Skin: Intact without lesions or rashes.  Neurologic: Alert and oriented x 3.  Psych: Normal affect. Extremities: No clubbing or cyanosis.  HEENT: Normal.   ASSESSMENT & PLAN: 1. Chronic Systolic CHF: Nonischemic cardiomyopathy, echo 03/10/19 with EF 25-30%, RV ok. No family history of cardiomyopathy. No significant coronary disease on cath in 11/20. No definite pre-existing viral symptoms. It is possible that this could be a PVC-mediated CMP given frequent PVCs at the time of diagnosis (or that the cardiomyopathy itself could be driving the PVCs). RHC in 11/20 showed low cardiac index at 1.8. CT chest in 11/20 with no evidence for sarcoid. Echo in 2/21 showed that EF remains 25-30%. MDT ICD placed in 5/21. Echo in 5/22 with EF up to 45%. NYHA class II symptoms, he is not volume overloaded on exam or by Optivol. - Continue Farxiga 10 mg daily.   - Continue Coreg 6.25 mg bid.  - With improvement in EF, think he can stop digoxin.  - Continue hydralazine  25 mg three times a day + 30 mg Imdur daily.  - Continue spiro to 25 mg daily. - Continue Entresto 49/51 bid.   - BMET today.  2. PVCs/NSVT: Very frequent when initially diagnosed in 11/20. ?Cause of cardiomyopathy versus result of cardiomyopathy. Decreased PVCs on amiodarone => Zio patch on amiodarone 100 mg daily showed no arrhythmias.  He now has a MDT ICD.  Amiodarone was stopped and 12/21 Zio patch was done, showing PVCs 9.7%.   Amiodarone was restarted.   - Continue amiodarone 100 mg daily, check LFTs and TSH today.  He will need a regular eye exam.  3. Smoking: No longer smoking  4. Erectile dysfunction: He can try Viagra, he will need to stop Imdur the day of use and the day after use.  We discussed this extensively.  Viagra cannot be used on the same day as Imdur.   Followup in 6 months with improved EF.   Marca Ancona 09/15/2020

## 2020-09-17 ENCOUNTER — Telehealth (HOSPITAL_COMMUNITY): Payer: Self-pay | Admitting: Pharmacy Technician

## 2020-09-17 ENCOUNTER — Other Ambulatory Visit (HOSPITAL_COMMUNITY): Payer: Self-pay | Admitting: *Deleted

## 2020-09-17 MED ORDER — HYDRALAZINE HCL 25 MG PO TABS: 25.0000 mg | ORAL_TABLET | Freq: Three times a day (TID) | ORAL | 3 refills | Status: DC

## 2020-09-17 NOTE — Telephone Encounter (Signed)
Sent in [employer] and [employer] application via fax.  Will follow up.

## 2020-09-22 ENCOUNTER — Telehealth (HOSPITAL_COMMUNITY): Payer: Self-pay

## 2020-09-22 ENCOUNTER — Ambulatory Visit (INDEPENDENT_AMBULATORY_CARE_PROVIDER_SITE_OTHER): Payer: 59

## 2020-09-22 DIAGNOSIS — I472 Ventricular tachycardia, unspecified: Secondary | ICD-10-CM

## 2020-09-22 DIAGNOSIS — I5022 Chronic systolic (congestive) heart failure: Secondary | ICD-10-CM

## 2020-09-22 NOTE — Telephone Encounter (Signed)
-----   Message from Laurey Morale, MD sent at 09/14/2020 10:27 PM EDT ----- I think he can stop digoxin with improved EF.  Also need to follow low K diet and make sure no K supplement.  Repeat BMET next week.

## 2020-09-22 NOTE — Telephone Encounter (Signed)
Lab results reviewed with patient and wife. Recommendations for low k diet reviewed.  Per MD, pt can stop Digoxin. Pt to repeat labs next week.  Verbalized understanding.

## 2020-09-23 LAB — CUP PACEART REMOTE DEVICE CHECK
Battery Remaining Longevity: 117 mo
Battery Remaining Longevity: 117 mo
Battery Voltage: 3.03 V
Battery Voltage: 3.03 V
Brady Statistic AP VP Percent: 0.02 %
Brady Statistic AP VP Percent: 0 %
Brady Statistic AP VS Percent: 39.36 %
Brady Statistic AP VS Percent: 74.87 %
Brady Statistic AS VP Percent: 0.01 %
Brady Statistic AS VP Percent: 0 %
Brady Statistic AS VS Percent: 60.6 %
Brady Statistic AS VS Percent: 25.13 %
Brady Statistic RA Percent Paced: 39.38 %
Brady Statistic RA Percent Paced: 74.86 %
Brady Statistic RV Percent Paced: 0.04 %
Brady Statistic RV Percent Paced: 0 %
Date Time Interrogation Session: 20220525022606
Date Time Interrogation Session: 20220525092016
HighPow Impedance: 78 Ohm
HighPow Impedance: 72 Ohm
Implantable Lead Implant Date: 20210526
Implantable Lead Implant Date: 20210526
Implantable Lead Implant Date: 20210526
Implantable Lead Implant Date: 20210526
Implantable Lead Location: 753859
Implantable Lead Location: 753860
Implantable Lead Location: 753859
Implantable Lead Location: 753860
Implantable Lead Model: 5076
Implantable Lead Model: 5076
Implantable Pulse Generator Implant Date: 20210526
Implantable Pulse Generator Implant Date: 20210526
Lead Channel Impedance Value: 437 Ohm
Lead Channel Impedance Value: 456 Ohm
Lead Channel Impedance Value: 532 Ohm
Lead Channel Impedance Value: 437 Ohm
Lead Channel Impedance Value: 494 Ohm
Lead Channel Impedance Value: 532 Ohm
Lead Channel Pacing Threshold Amplitude: 0.5 V
Lead Channel Pacing Threshold Amplitude: 0.625 V
Lead Channel Pacing Threshold Amplitude: 0.5 V
Lead Channel Pacing Threshold Amplitude: 0.625 V
Lead Channel Pacing Threshold Pulse Width: 0.4 ms
Lead Channel Pacing Threshold Pulse Width: 0.4 ms
Lead Channel Pacing Threshold Pulse Width: 0.4 ms
Lead Channel Pacing Threshold Pulse Width: 0.4 ms
Lead Channel Sensing Intrinsic Amplitude: 3.625 mV
Lead Channel Sensing Intrinsic Amplitude: 3.625 mV
Lead Channel Sensing Intrinsic Amplitude: 5.75 mV
Lead Channel Sensing Intrinsic Amplitude: 5.75 mV
Lead Channel Sensing Intrinsic Amplitude: 4.875 mV
Lead Channel Sensing Intrinsic Amplitude: 4.875 mV
Lead Channel Sensing Intrinsic Amplitude: 5.75 mV
Lead Channel Sensing Intrinsic Amplitude: 5.75 mV
Lead Channel Setting Pacing Amplitude: 1.5 V
Lead Channel Setting Pacing Amplitude: 2.5 V
Lead Channel Setting Pacing Amplitude: 1.5 V
Lead Channel Setting Pacing Amplitude: 2.5 V
Lead Channel Setting Pacing Pulse Width: 0.4 ms
Lead Channel Setting Pacing Pulse Width: 0.4 ms
Lead Channel Setting Sensing Sensitivity: 0.3 mV
Lead Channel Setting Sensing Sensitivity: 0.3 mV

## 2020-09-24 ENCOUNTER — Telehealth (HOSPITAL_COMMUNITY): Payer: Self-pay | Admitting: Pharmacy Technician

## 2020-09-24 ENCOUNTER — Other Ambulatory Visit (HOSPITAL_COMMUNITY): Payer: Self-pay

## 2020-09-24 NOTE — Telephone Encounter (Signed)
Advanced Heart Failure Patient Advocate Encounter  Patient was approved to receive Entresto from [employer]  Patient ID: [id number] Effective dates: 09/16/20 through 04/30/21  Called and spoke with [employer] regarding Marcelline Deist assistance. [employer] will not cover this patient under their assistance program unless his insurance does not pay towards the co-pay at all. I think it would be worth trying to get Jardiance assistance. In the past, have had some success with commercially insured patient's.   Archer Asa, CPhT

## 2020-09-28 ENCOUNTER — Other Ambulatory Visit: Payer: Self-pay

## 2020-09-28 ENCOUNTER — Ambulatory Visit (HOSPITAL_COMMUNITY)
Admission: RE | Admit: 2020-09-28 | Discharge: 2020-09-28 | Disposition: A | Discharge status: Home / Self Care | Payer: 59 | Source: Ambulatory Visit | Attending: Cardiology | Admitting: Cardiology

## 2020-09-28 DIAGNOSIS — I5022 Chronic systolic (congestive) heart failure: Secondary | ICD-10-CM | POA: Diagnosis not present

## 2020-09-28 LAB — BASIC METABOLIC PANEL
Anion gap: 7 (ref 5–15)
BUN: 13 mg/dL (ref 8–23)
CO2: 26 mmol/L (ref 22–32)
Calcium: 9.1 mg/dL (ref 8.9–10.3)
Chloride: 100 mmol/L (ref 98–111)
Creatinine, Ser: 1.04 mg/dL (ref 0.61–1.24)
GFR, Estimated: >60 mL/min (ref >60)
Glucose, Bld: 153 mg/dL — ABNORMAL HIGH (ref 70–99)
Potassium: 4 mmol/L (ref 3.5–5.1)
Sodium: 133 mmol/L — ABNORMAL LOW (ref 135–145)

## 2020-09-30 ENCOUNTER — Other Ambulatory Visit (HOSPITAL_COMMUNITY): Payer: 59

## 2020-10-07 ENCOUNTER — Other Ambulatory Visit (HOSPITAL_COMMUNITY): Payer: Self-pay | Admitting: Cardiology

## 2020-10-07 ENCOUNTER — Other Ambulatory Visit (HOSPITAL_COMMUNITY): Payer: Self-pay

## 2020-10-07 ENCOUNTER — Telehealth (HOSPITAL_COMMUNITY): Payer: Self-pay | Admitting: Pharmacy Technician

## 2020-10-07 NOTE — Telephone Encounter (Signed)
Advanced Heart Failure Patient Advocate Encounter  Spoke with the patient's wife. The patient is willing to see if a [employer] approval can be obtained. The current 30 day co-pay for Jardiance after the co-pay card is $101, (the patient has switched insurance's from previous notes in Feb.). This co-pay is unaffordable for the patient. If we get an approval, will switch the patient back over to [place]. If the application is denied, the patient will not be able to afford either SGLT2 inhibitor.   Emailed an Landscape architect for [facility] to the patient, as requested.  Will fax in once signatures are obtained.

## 2020-10-14 NOTE — Progress Notes (Signed)
Remote ICD transmission.   

## 2020-10-25 ENCOUNTER — Encounter: Payer: 59 | Admitting: Cardiology

## 2020-10-27 ENCOUNTER — Other Ambulatory Visit (HOSPITAL_COMMUNITY): Payer: Self-pay

## 2020-11-04 ENCOUNTER — Telehealth (HOSPITAL_COMMUNITY): Payer: Self-pay | Admitting: Cardiology

## 2020-11-04 NOTE — Telephone Encounter (Signed)
Pts wife called to request update on farxiga application as he will run out of meds in a few days  Advised  I have no way to check application and staff responsible is on vacation however we could provide samples and message team so status check could be completed once she returns.    Medication Samples have been provided to the patient.  Drug name: farxiga       Strength: 10mg         Qty: 28  LOT: [id number]  Exp.Date: 03/31/2023  Dosing instructions: one tab daily   The patient has been instructed regarding the correct time, dose, and frequency of taking this medication, including desired effects and most common side effects.   04/02/2023 10:53 AM 11/04/2020

## 2020-11-04 NOTE — Telephone Encounter (Signed)
Advanced Heart Failure Patient Advocate Encounter  Submitted Patient Assistance Application to [facility] for  Jardiance  along with provider portion. Will update patient when we receive a response.  Fax# [phone] Phone# [phone]

## 2020-11-10 ENCOUNTER — Other Ambulatory Visit (HOSPITAL_COMMUNITY): Payer: Self-pay | Admitting: *Deleted

## 2020-11-10 ENCOUNTER — Telehealth (HOSPITAL_COMMUNITY): Payer: Self-pay | Admitting: Pharmacy Technician

## 2020-11-10 MED ORDER — EMPAGLIFLOZIN 10 MG PO TABS: 10.0000 mg | ORAL_TABLET | Freq: Every day | ORAL | 3 refills | Status: DC

## 2020-11-10 NOTE — Telephone Encounter (Signed)
Advanced Heart Failure Patient Advocate Encounter   Patient was approved to receive Jardiance from [employer]  Effective dates: 11/05/20 through 04/30/21  Called and spoke with the patient's wife. Advised her to use all the [place] that they currently have, switch to [place] the day after his last [place] dose. Patient's wife verbalized understanding.   Sent Jasmine (CMA) a request to update the patient's med list.  Archer Asa, CPhT

## 2020-11-22 ENCOUNTER — Ambulatory Visit (INDEPENDENT_AMBULATORY_CARE_PROVIDER_SITE_OTHER): Payer: 59 | Admitting: Cardiology

## 2020-11-22 ENCOUNTER — Other Ambulatory Visit: Payer: Self-pay

## 2020-11-22 ENCOUNTER — Encounter: Payer: Self-pay | Admitting: Cardiology

## 2020-11-22 VITALS — BP 118/74 | HR 60 | Ht 72.0 in | Wt 147.6 lb

## 2020-11-22 DIAGNOSIS — I428 Other cardiomyopathies: Secondary | ICD-10-CM

## 2020-11-22 NOTE — Patient Instructions (Signed)
Medication Instructions:  Your physician recommends that you continue on your current medications as directed. Please refer to the Current Medication list given to you today.  *If you need a refill on your cardiac medications before your next appointment, please call your pharmacy*   Lab Work: None ordered If you have labs (blood work) drawn today and your tests are completely normal, you will receive your results only by: MyChart Message (if you have MyChart) OR A paper copy in the mail If you have any lab test that is abnormal or we need to change your treatment, we will call you to review the results.   Testing/Procedures: None ordered   Follow-Up: At [facility], you and your health needs are our priority.  As part of our continuing mission to provide you with exceptional heart care, we have created designated Provider Care Teams.  These Care Teams include your primary Cardiologist (physician) and Advanced Practice Providers (APPs -  Physician Assistants and Nurse Practitioners) who all work together to provide you with the care you need, when you need it.  Remote monitoring is used to monitor your Pacemaker or ICD from home. This monitoring reduces the number of office visits required to check your device to one time per year. It allows [place] to keep an eye on the functioning of your device to ensure it is working properly. You are scheduled for a device check from home on 12/22/2020. You may send your transmission at any time that day. If you have a wireless device, the transmission will be sent automatically. After your physician reviews your transmission, you will receive a postcard with your next transmission date.  Your next appointment:   1 year(s)  The format for your next appointment:   In Person  Provider:   Loman Brooklyn, MD   Thank you for choosing [facility]!!   Dory Horn, RN [phone]    Other Instructions

## 2020-11-22 NOTE — Progress Notes (Signed)
Electrophysiology Office Note   Date:  11/22/2020   ID:  Logan, Lopez 09-13-1955, MRN [id number]  PCP:  Logan Rigg, NP  Cardiologist:  Logan Lopez Primary Electrophysiologist:  Logan Lopez Jorja Loa, MD    Chief Complaint: CHF   History of Present Illness: Logan Lopez is a 65 y.o. male who is being seen today for the evaluation of CHF at the request of Logan Rigg, NP. Presenting today for electrophysiology evaluation.  He has a history significant for anemia, former tobacco abuse, PVCs, chronic systolic heart failure due to nonischemic cardiomyopathy.  He was hospitalized November 2020 with dyspnea and was found to have an ejection fraction of 25 to 30%.  Catheterization showed low output but no obstructive coronary artery disease.  He was also noted to have an elevated PVC burden when he was in the hospital and started on amiodarone.  He is now status post Medtronic dual-chamber ICD implanted 09/24/2019.  Today, denies symptoms of palpitations, chest pain, shortness of breath, orthopnea, PND, lower extremity edema, claudication, dizziness, presyncope, syncope, bleeding, or neurologic sequela. The patient is tolerating medications without difficulties.  Feeling well.  He has no chest pain or shortness of breath.  He is able to do all of his daily activities without restriction.  Past Medical History:  Diagnosis Date   CHF (congestive heart failure) (HCC)    new 03/2019   Dyspnea    Hypertension    Past Surgical History:  Procedure Laterality Date   ICD IMPLANT N/A 09/24/2019   Procedure: ICD IMPLANT;  Surgeon: Logan Lemming, MD;  Location[facility] INVASIVE CV LAB;  Service: Cardiovascular;  Laterality: N/A;   RIGHT/LEFT HEART CATH AND CORONARY ANGIOGRAPHY N/A 03/12/2019   Procedure: RIGHT/LEFT HEART CATH AND CORONARY ANGIOGRAPHY;  Surgeon: Logan Morale, MD;  Location: [facility] INVASIVE CV LAB;  Service: Cardiovascular;  Laterality: N/A;   WISDOM TOOTH EXTRACTION        Current Outpatient Medications  Medication Sig Dispense Refill   acetaminophen (TYLENOL) 500 MG tablet Take 1,000 mg by mouth every 6 (six) hours as needed for mild pain.     amiodarone (PACERONE) 200 MG tablet Take 0.5 tablets (100 mg total) by mouth daily. 45 tablet 3   carvedilol (COREG) 6.25 MG tablet Take 1 tablet (6.25 mg total) by mouth 2 (two) times daily with a meal. 180 tablet 3   empagliflozin (JARDIANCE) 10 MG TABS tablet Take 1 tablet (10 mg total) by mouth daily before breakfast. 30 tablet 3   ENTRESTO 49-51 MG Take 1 tablet by mouth twice daily 120 tablet 0   ferrous sulfate 325 (65 FE) MG tablet Take 325 mg by mouth daily with breakfast.     furosemide (LASIX) 40 MG tablet Take 1 tablet (40 mg total) by mouth as needed. 30 tablet 0   hydrALAZINE (APRESOLINE) 25 MG tablet TAKE 1 TABLET BY MOUTH THREE TIMES DAILY 90 tablet 0   hydrALAZINE (APRESOLINE) 25 MG tablet Take 1 tablet (25 mg total) by mouth 3 (three) times daily. 90 tablet 3   isosorbide mononitrate (IMDUR) 30 MG 24 hr tablet Take 1 tablet by mouth once daily 90 tablet 3   sildenafil (VIAGRA) 50 MG tablet Take 1 tablet (50 mg total) by mouth daily as needed for erectile dysfunction. DO NOT TAKE IMDUR ON DAYS YOU TAKE VIAGRA OR NEXT DAY 10 tablet 0   spironolactone (ALDACTONE) 25 MG tablet Take 1 tablet by mouth once daily 90 tablet 3  vitamin C (ASCORBIC ACID) 500 MG tablet Take 500 mg by mouth daily.     No current facility-administered medications for this visit.    Allergies:   Penicillins   Social History:  The patient  reports that he has quit smoking. His smoking use included cigarettes. He has never used smokeless tobacco. He reports previous alcohol use. He reports that he does not use drugs.   Family History:   No family history of heart disease.  Brothers are all healthy.  Otherwise he is unaware of medical issues in his family.  ROS:  Please see the history of present illness.   Otherwise, review  of systems is positive for none.   All other systems are reviewed and negative.   PHYSICAL EXAM: VS:  BP 118/74   Pulse 60   Ht 6' (1.829 m)   Wt 147 lb 9.6 oz (67 kg)   SpO2 99%   BMI 20.02 kg/m  , BMI Body mass index is 20.02 kg/m. GEN: Well nourished, well developed, in no acute distress  HEENT: normal  Neck: no JVD, carotid bruits, or masses Cardiac: RRR; no murmurs, rubs, or gallops,no edema  Respiratory:  clear to auscultation bilaterally, normal work of breathing GI: soft, nontender, nondistended, + BS MS: no deformity or atrophy  Skin: warm and dry, device site well healed Neuro:  Strength and sensation are intact Psych: euthymic mood, full affect  EKG:  EKG is ordered today. Personal review of the ekg ordered shows atrial paced, rate 60  Personal review of the device interrogation today. Results in Paceart   Recent Labs: 09/14/2020: ALT 24; TSH 0.654 09/28/2020: BUN 13; Creatinine, Ser 1.04; Potassium 4.0; Sodium 133    Lipid Panel     Component Value Date/Time   CHOL 134 03/12/2019 0340   TRIG 81 03/12/2019 0340   HDL 28 (L) 03/12/2019 0340   CHOLHDL 4.8 03/12/2019 0340   VLDL 16 03/12/2019 0340   LDLCALC 90 03/12/2019 0340     Wt Readings from Last 3 Encounters:  11/22/20 147 lb 9.6 oz (67 kg)  09/14/20 149 lb 6.4 oz (67.8 kg)  06/11/20 151 lb (68.5 kg)      Other studies Reviewed: Additional studies/ records that were reviewed today include: TTE 09/14/20 Review of the above records today demonstrates:   1. Left ventricular ejection fraction, by estimation, is 45%. The left  ventricle has mildly decreased function. The left ventricle demonstrates  global hypokinesis. Left ventricular diastolic parameters are consistent  with Grade I diastolic dysfunction  (impaired relaxation).   2. Right ventricular systolic function is normal. The right ventricular  size is normal. There is normal pulmonary artery systolic pressure. The  estimated right  ventricular systolic pressure is 23.6 mmHg.   3. The mitral valve is normal in structure. No evidence of mitral valve  regurgitation.   4. The aortic valve is tricuspid. Aortic valve regurgitation is mild.   5. The inferior vena cava is normal in size with greater than 50%  respiratory variability, suggesting right atrial pressure of 3 mmHg.    RHC/LHC 03/12/19 1. Mild elevation in PCWP, normal RA pressure.  2. Mild primarily pulmonary venous hypertension.  3. Low cardiac output, CI 1.8.  4. No significant coronary disease, nonischemic cardiomyopathy.   Monitor 07/13/19 personally reviewed NSR.  Relatively normal study with no significant arrhythmia noted.   ASSESSMENT AND PLAN:  1.  Chronic systolic heart failure due to nonischemic cardiomyopathy: Ejection fraction 30 to 35%.  Currently on medical therapy.  Status post Medtronic dual-chamber ICD implanted 09/24/2019.  Device appropriately.  No changes at this time.    2.  PVCs: Burden decreased on amiodarone.  High risk medication monitoring.  Minimal PVCs noted on the low-dose amiodarone.  We Dameka Younker continue with current management.   Current medicines are reviewed at length with the patient today.   The patient does not have concerns regarding his medicines.  The following changes were made today: None  Labs/ tests ordered today include:  Orders Placed This Encounter  Procedures   EKG 12-Lead      Disposition:   FU with Margaurite Salido 12 months  Signed, Deloy Archey Jorja Loa, MD  11/22/2020 4:18 PM     [facility] [address] [place] [address] [phone] (office) [phone] (fax)

## 2020-11-24 NOTE — Telephone Encounter (Signed)
Opened in error

## 2020-12-04 ENCOUNTER — Other Ambulatory Visit: Payer: Self-pay | Admitting: Cardiology

## 2020-12-14 IMAGING — CT CT ABD-PELV W/ CM
2 of 8 series · 15 of 46 positions shown, 17 images · IV contrast (Omnipaque)
Comparison: Single-view of the chest today.

CLINICAL DATA: Worsening upper abdominal pain over the past few
weeks. Shortness of breath for 2 days.

EXAM:
CT ANGIOGRAPHY CHEST
CT ABDOMEN AND PELVIS WITH CONTRAST
TECHNIQUE: Multidetector CT imaging of the chest was performed using the
standard protocol during bolus administration of intravenous
contrast. Multiplanar CT image reconstructions and MIPs were
obtained to evaluate the vascular anatomy. Multidetector CT imaging
of the abdomen and pelvis was performed using the standard protocol
during bolus administration of intravenous contrast.
CONTRAST:  100 mL OMNIPAQUE IOHEXOL 350 MG/ML SOLN

[Series 6: axial st · axial · 0.73mm/px · z∈[-845,-195]mm · 12 of 146 slices shown, 14 images]
[im 8/146  soft-tissue]
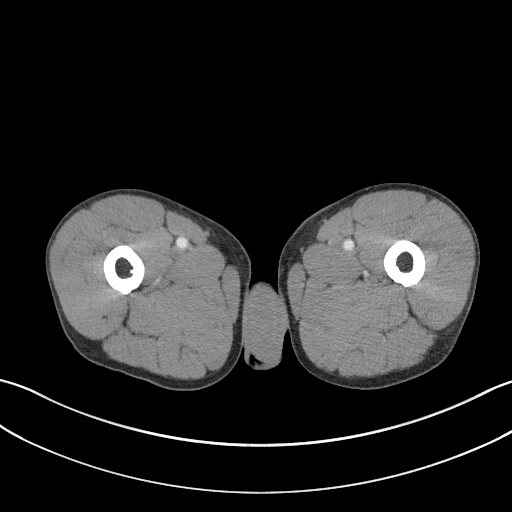
[im 8/146  bone]
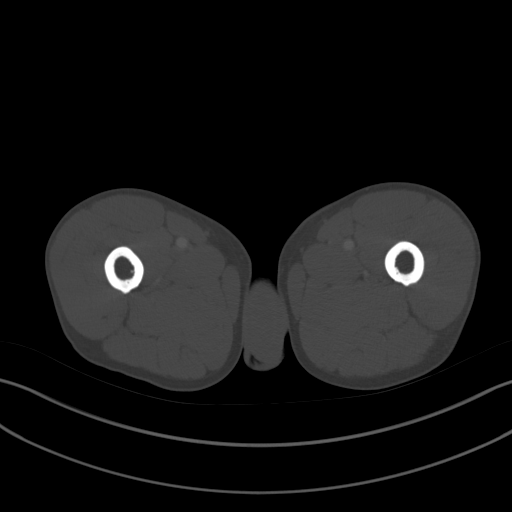
[im 23/146  soft-tissue]
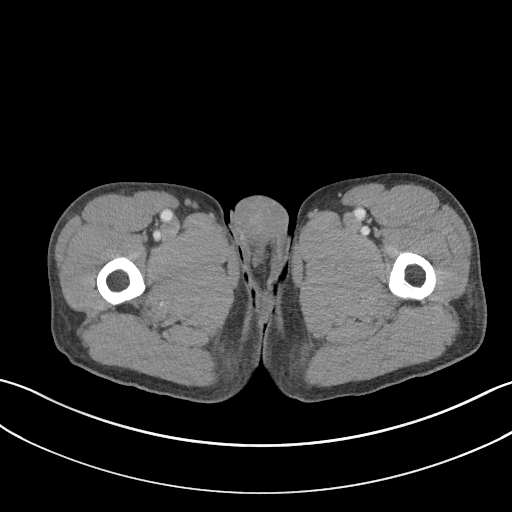
[im 31/146  soft-tissue]
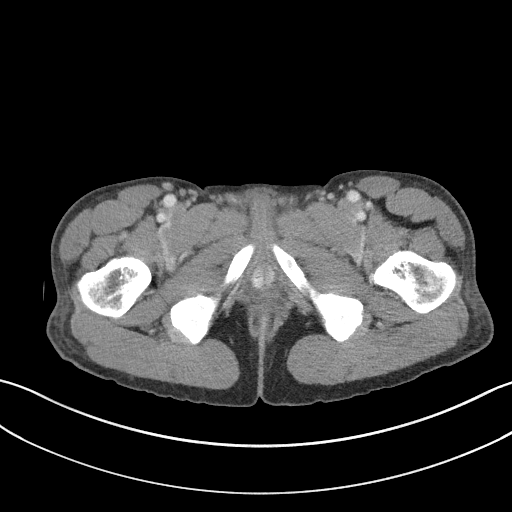
[im 46/146  soft-tissue]
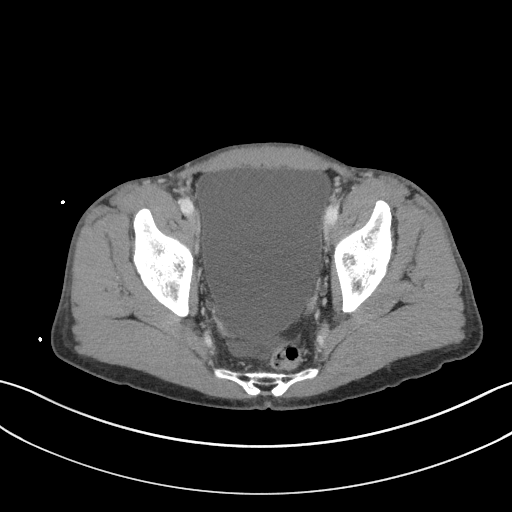
[im 54/146  soft-tissue]
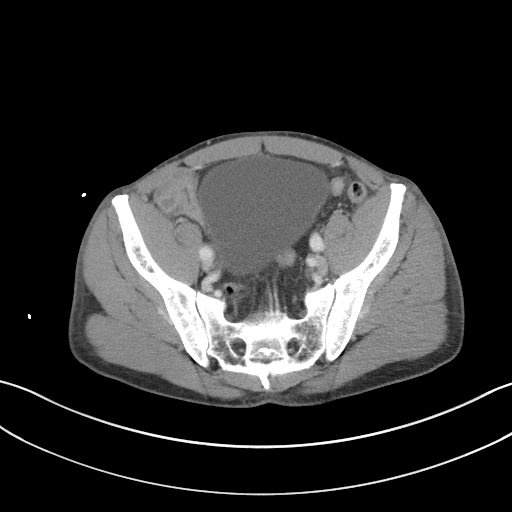
[im 69/146  soft-tissue]
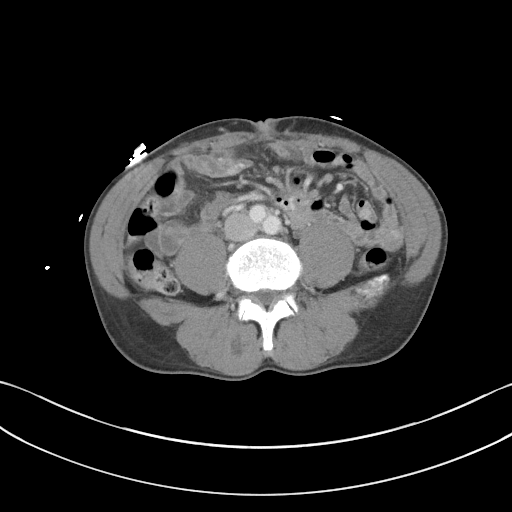
[im 77/146  soft-tissue]
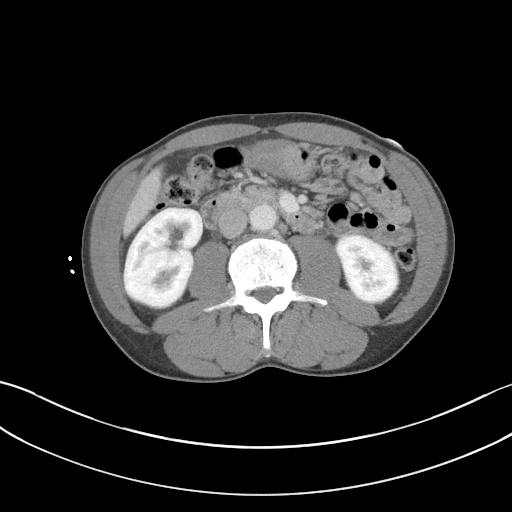
[im 92/146  soft-tissue]
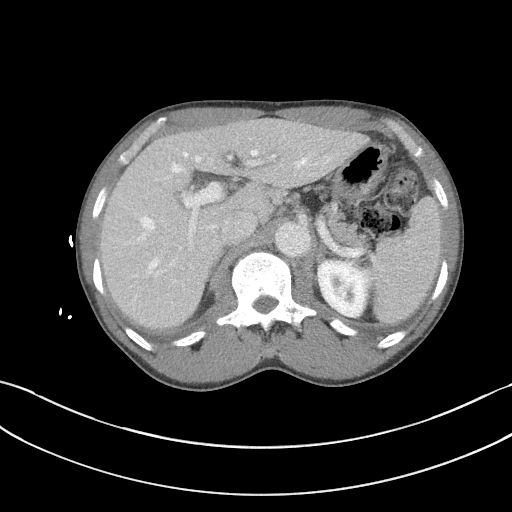
[im 100/146  soft-tissue]
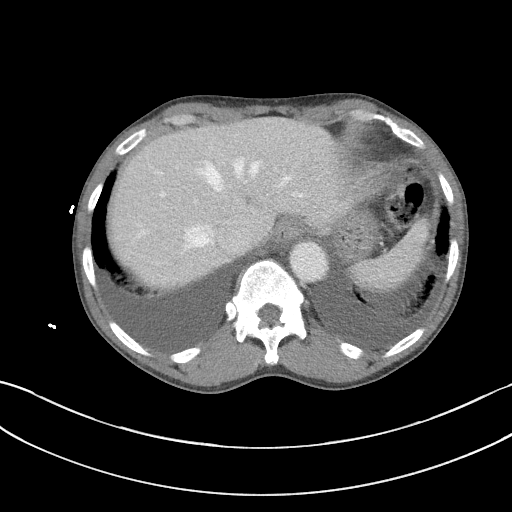
[im 100/146  bone]
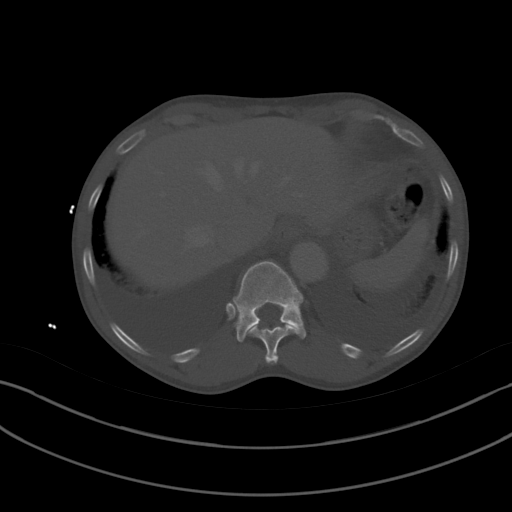
[im 115/146  soft-tissue]
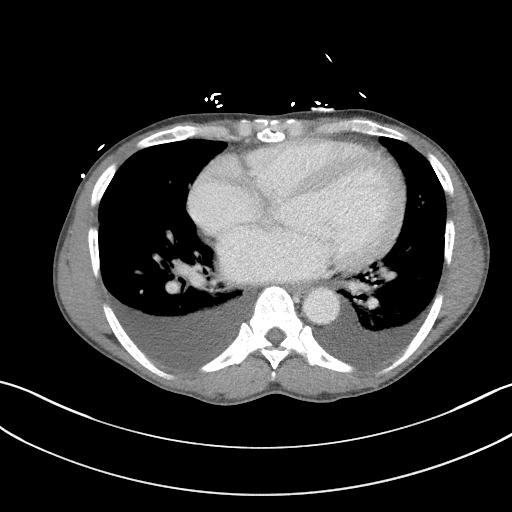
[im 123/146  soft-tissue]
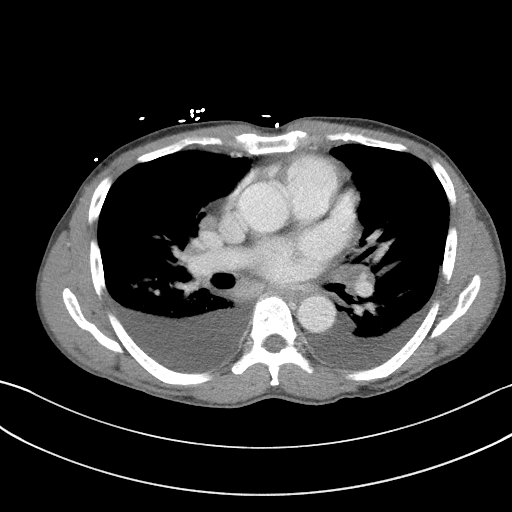
[im 138/146  soft-tissue]
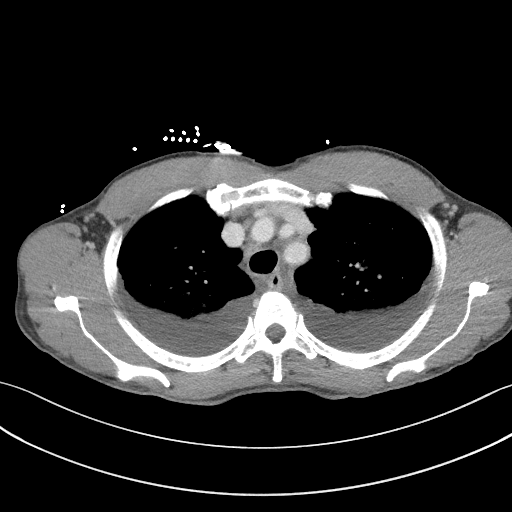

[Series 9: abd/pel coronal st · coronal · 0.96mm/px · 3 of 76 slices shown]
[im 26/76  soft-tissue]
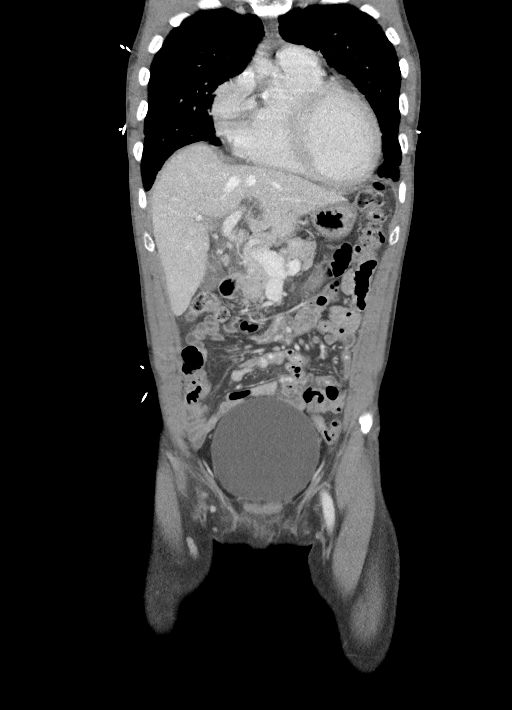
[im 34/76  soft-tissue]
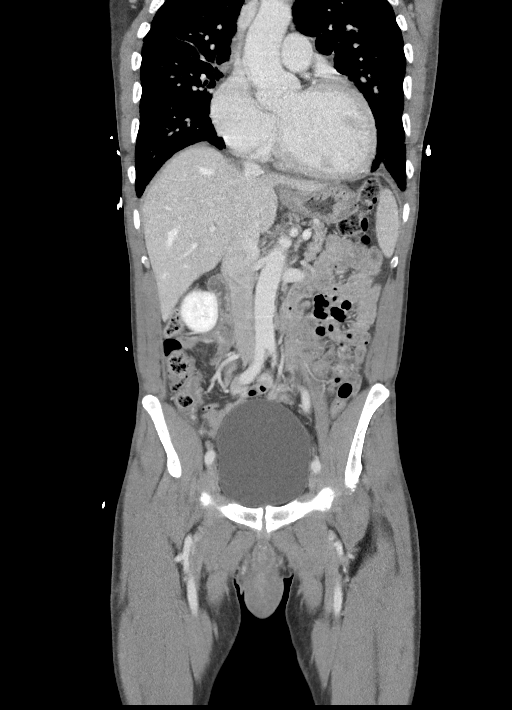
[im 42/76  soft-tissue]
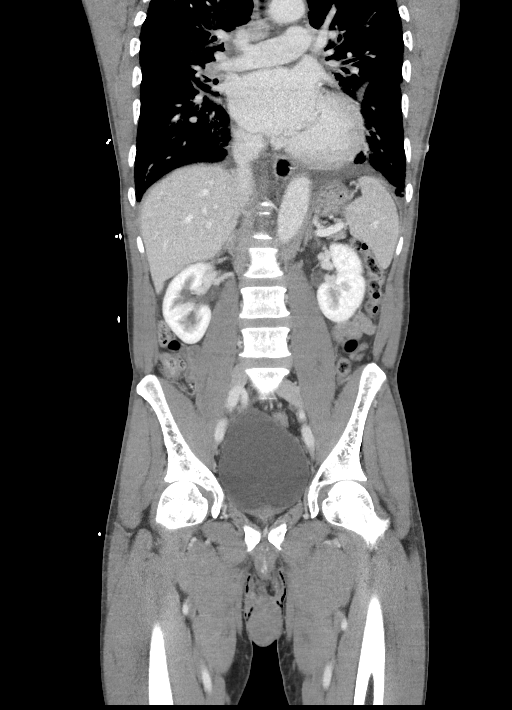

[15 of 46 positions shown; findings below may reference images not displayed]

FINDINGS: CTA CHEST FINDINGS

Cardiovascular: Satisfactory opacification of the pulmonary arteries
to the segmental level. No evidence of pulmonary embolism. Marked
cardiomegaly. No pericardial effusion.

Mediastinum/Nodes: No enlarged mediastinal, hilar, or axillary lymph
nodes. Thyroid gland, trachea, and esophagus demonstrate no
significant findings.

Lungs/Pleura: Moderate bilateral pleural effusions. Ground-glass
attenuation is seen throughout. No nodule, mass or consolidative
process. Emphysematous change is seen in the apices.

Musculoskeletal: No acute or focal abnormality.

Review of the MIP images confirms the above findings.

CT ABDOMEN and PELVIS FINDINGS

Hepatobiliary: No focal liver abnormality is seen. No gallstones,
gallbladder wall thickening, or biliary dilatation.

Pancreas: Unremarkable. No pancreatic ductal dilatation or
surrounding inflammatory changes.

Spleen: Normal in size without focal abnormality.

Adrenals/Urinary Tract: Adrenal glands are unremarkable. Kidneys are
normal, without renal calculi, focal lesion, or hydronephrosis.
Bladder is unremarkable.

Stomach/Bowel: Stomach is within normal limits. Appendix appears
normal. No evidence of bowel wall thickening, distention, or
inflammatory changes.

Vascular/Lymphatic: No significant vascular findings are present. No
enlarged abdominal or pelvic lymph nodes.

Reproductive: Prostate is unremarkable.

Other: There is a small volume of free pelvic fluid.

Musculoskeletal: No acute or focal bony abnormality.

Review of the MIP images confirms the above findings.
IMPRESSION: Ground-glass attenuation throughout both lungs is likely due to
pulmonary edema in this patient with marked cardiomegaly and
moderate bilateral pleural effusions.

Negative for pulmonary embolus.

Small volume of free pelvic fluid is nonspecific and could be due to
volume overload or possibly enteritis. Bowel loops appear normal.

## 2020-12-15 ENCOUNTER — Other Ambulatory Visit (HOSPITAL_COMMUNITY): Payer: 59

## 2020-12-17 ENCOUNTER — Other Ambulatory Visit (HOSPITAL_COMMUNITY): Payer: 59

## 2020-12-22 ENCOUNTER — Ambulatory Visit (INDEPENDENT_AMBULATORY_CARE_PROVIDER_SITE_OTHER): Payer: 59

## 2020-12-22 DIAGNOSIS — I428 Other cardiomyopathies: Secondary | ICD-10-CM | POA: Diagnosis not present

## 2020-12-22 LAB — CUP PACEART REMOTE DEVICE CHECK
Battery Remaining Longevity: 115 mo
Battery Voltage: 3.03 V
Brady Statistic AP VP Percent: 0.02 %
Brady Statistic AP VS Percent: 33.78 %
Brady Statistic AS VP Percent: 0.02 %
Brady Statistic AS VS Percent: 66.18 %
Brady Statistic RA Percent Paced: 33.8 %
Brady Statistic RV Percent Paced: 0.04 %
Date Time Interrogation Session: 20220824001704
HighPow Impedance: 76 Ohm
Implantable Lead Implant Date: 20210526
Implantable Lead Implant Date: 20210526
Implantable Lead Location: 753859
Implantable Lead Location: 753860
Implantable Lead Model: 5076
Implantable Pulse Generator Implant Date: 20210526
Lead Channel Impedance Value: 437 Ohm
Lead Channel Impedance Value: 456 Ohm
Lead Channel Impedance Value: 513 Ohm
Lead Channel Pacing Threshold Amplitude: 0.375 V
Lead Channel Pacing Threshold Amplitude: 0.625 V
Lead Channel Pacing Threshold Pulse Width: 0.4 ms
Lead Channel Pacing Threshold Pulse Width: 0.4 ms
Lead Channel Sensing Intrinsic Amplitude: 5 mV
Lead Channel Sensing Intrinsic Amplitude: 5 mV
Lead Channel Sensing Intrinsic Amplitude: 9.375 mV
Lead Channel Sensing Intrinsic Amplitude: 9.375 mV
Lead Channel Setting Pacing Amplitude: 1.5 V
Lead Channel Setting Pacing Amplitude: 2.5 V
Lead Channel Setting Pacing Pulse Width: 0.4 ms
Lead Channel Setting Sensing Sensitivity: 0.3 mV

## 2020-12-27 ENCOUNTER — Other Ambulatory Visit: Payer: Self-pay

## 2020-12-27 ENCOUNTER — Ambulatory Visit (HOSPITAL_COMMUNITY)
Admission: RE | Admit: 2020-12-27 | Discharge: 2020-12-27 | Disposition: A | Discharge status: Home / Self Care | Payer: 59 | Source: Ambulatory Visit | Attending: Internal Medicine | Admitting: Internal Medicine

## 2020-12-27 DIAGNOSIS — I5022 Chronic systolic (congestive) heart failure: Secondary | ICD-10-CM | POA: Insufficient documentation

## 2020-12-27 LAB — BASIC METABOLIC PANEL
Anion gap: 9 (ref 5–15)
BUN: 11 mg/dL (ref 8–23)
CO2: 24 mmol/L (ref 22–32)
Calcium: 9 mg/dL (ref 8.9–10.3)
Chloride: 102 mmol/L (ref 98–111)
Creatinine, Ser: 1.01 mg/dL (ref 0.61–1.24)
GFR, Estimated: >60 mL/min (ref >60)
Glucose, Bld: 189 mg/dL — ABNORMAL HIGH (ref 70–99)
Potassium: 3.7 mmol/L (ref 3.5–5.1)
Sodium: 135 mmol/L (ref 135–145)

## 2021-01-06 NOTE — Progress Notes (Signed)
Remote ICD transmission.   

## 2021-02-20 ENCOUNTER — Other Ambulatory Visit (HOSPITAL_COMMUNITY): Payer: Self-pay | Admitting: Cardiology

## 2021-02-23 ENCOUNTER — Telehealth (HOSPITAL_COMMUNITY): Payer: Self-pay | Admitting: Vascular Surgery

## 2021-03-01 NOTE — Telephone Encounter (Signed)
Open in error

## 2021-03-04 ENCOUNTER — Other Ambulatory Visit: Payer: Self-pay | Admitting: Cardiology

## 2021-03-10 ENCOUNTER — Other Ambulatory Visit (HOSPITAL_COMMUNITY): Payer: Self-pay

## 2021-03-10 ENCOUNTER — Telehealth (HOSPITAL_COMMUNITY): Payer: Self-pay | Admitting: Pharmacy Technician

## 2021-03-10 NOTE — Telephone Encounter (Signed)
Advanced Heart Failure Patient Advocate Encounter  Patient's wife called and had questions about renewing patient assistance for [place] The patient currently has a Water engineer. The patient's wife stated that the plan would no longer service [place] residents so they will be looking into another plan for next year.  We can make a plan of how to handle next year based off of which insurance plan they end up choosing. I suggested that they make sure that he gets one more 90 day refill before the end of eligibility, 04/30/21. The patient's wife is going to call back in January most likely, when the new plan is active.   Archer Asa, CPhT

## 2021-03-23 ENCOUNTER — Ambulatory Visit (INDEPENDENT_AMBULATORY_CARE_PROVIDER_SITE_OTHER): Payer: 59

## 2021-03-23 DIAGNOSIS — I428 Other cardiomyopathies: Secondary | ICD-10-CM

## 2021-03-23 LAB — CUP PACEART REMOTE DEVICE CHECK
Battery Remaining Longevity: 111 mo
Battery Voltage: 3.02 V
Brady Statistic AP VP Percent: 0.02 %
Brady Statistic AP VS Percent: 30.03 %
Brady Statistic AS VP Percent: 0.02 %
Brady Statistic AS VS Percent: 69.93 %
Brady Statistic RA Percent Paced: 30.05 %
Brady Statistic RV Percent Paced: 0.04 %
Date Time Interrogation Session: 20221123043824
HighPow Impedance: 70 Ohm
Implantable Lead Implant Date: 20210526
Implantable Lead Implant Date: 20210526
Implantable Lead Location: 753859
Implantable Lead Location: 753860
Implantable Lead Model: 5076
Implantable Pulse Generator Implant Date: 20210526
Lead Channel Impedance Value: 380 Ohm
Lead Channel Impedance Value: 456 Ohm
Lead Channel Impedance Value: 532 Ohm
Lead Channel Pacing Threshold Amplitude: 0.5 V
Lead Channel Pacing Threshold Amplitude: 0.625 V
Lead Channel Pacing Threshold Pulse Width: 0.4 ms
Lead Channel Pacing Threshold Pulse Width: 0.4 ms
Lead Channel Sensing Intrinsic Amplitude: 3.625 mV
Lead Channel Sensing Intrinsic Amplitude: 3.625 mV
Lead Channel Sensing Intrinsic Amplitude: 6 mV
Lead Channel Sensing Intrinsic Amplitude: 6 mV
Lead Channel Setting Pacing Amplitude: 1.5 V
Lead Channel Setting Pacing Amplitude: 2.5 V
Lead Channel Setting Pacing Pulse Width: 0.4 ms
Lead Channel Setting Sensing Sensitivity: 0.3 mV

## 2021-04-04 NOTE — Progress Notes (Signed)
Remote ICD transmission.   

## 2021-04-07 ENCOUNTER — Other Ambulatory Visit (HOSPITAL_COMMUNITY): Payer: Self-pay | Admitting: Cardiology

## 2021-05-09 ENCOUNTER — Other Ambulatory Visit: Payer: Self-pay

## 2021-05-09 ENCOUNTER — Other Ambulatory Visit (HOSPITAL_COMMUNITY): Payer: Self-pay

## 2021-05-09 ENCOUNTER — Ambulatory Visit (HOSPITAL_COMMUNITY)
Admission: RE | Admit: 2021-05-09 | Discharge: 2021-05-09 | Disposition: A | Discharge status: Home / Self Care | Payer: Self-pay | Source: Ambulatory Visit | Attending: Cardiology | Admitting: Cardiology

## 2021-05-09 ENCOUNTER — Encounter (HOSPITAL_COMMUNITY): Payer: Self-pay | Admitting: Cardiology

## 2021-05-09 VITALS — BP 92/60 | HR 72 | Wt 147.6 lb

## 2021-05-09 DIAGNOSIS — Z7984 Long term (current) use of oral hypoglycemic drugs: Secondary | ICD-10-CM | POA: Insufficient documentation

## 2021-05-09 DIAGNOSIS — Z87891 Personal history of nicotine dependence: Secondary | ICD-10-CM | POA: Insufficient documentation

## 2021-05-09 DIAGNOSIS — I5022 Chronic systolic (congestive) heart failure: Secondary | ICD-10-CM | POA: Insufficient documentation

## 2021-05-09 DIAGNOSIS — Z79899 Other long term (current) drug therapy: Secondary | ICD-10-CM | POA: Insufficient documentation

## 2021-05-09 DIAGNOSIS — Z95810 Presence of automatic (implantable) cardiac defibrillator: Secondary | ICD-10-CM | POA: Insufficient documentation

## 2021-05-09 DIAGNOSIS — I110 Hypertensive heart disease with heart failure: Secondary | ICD-10-CM | POA: Insufficient documentation

## 2021-05-09 DIAGNOSIS — I493 Ventricular premature depolarization: Secondary | ICD-10-CM | POA: Insufficient documentation

## 2021-05-09 DIAGNOSIS — R002 Palpitations: Secondary | ICD-10-CM | POA: Insufficient documentation

## 2021-05-09 LAB — COMPREHENSIVE METABOLIC PANEL
ALT: 22 U/L (ref 0–44)
AST: 27 U/L (ref 15–41)
Albumin: 3.6 g/dL (ref 3.5–5.0)
Alkaline Phosphatase: 72 U/L (ref 38–126)
Anion gap: 6 (ref 5–15)
BUN: 9 mg/dL (ref 8–23)
CO2: 27 mmol/L (ref 22–32)
Calcium: 8.9 mg/dL (ref 8.9–10.3)
Chloride: 104 mmol/L (ref 98–111)
Creatinine, Ser: 1.01 mg/dL (ref 0.61–1.24)
GFR, Estimated: >60 mL/min (ref >60)
Glucose, Bld: 95 mg/dL (ref 70–99)
Potassium: 3.6 mmol/L (ref 3.5–5.1)
Sodium: 137 mmol/L (ref 135–145)
Total Bilirubin: 0.3 mg/dL (ref 0.3–1.2)
Total Protein: 7.3 g/dL (ref 6.5–8.1)

## 2021-05-09 LAB — TSH: TSH: 0.598 u[IU]/mL (ref 0.350–4.500)

## 2021-05-09 MED ORDER — ENTRESTO 49-51 MG PO TABS: 1.0000 | ORAL_TABLET | Freq: Two times a day (BID) | ORAL | 3 refills | Status: DC

## 2021-05-09 MED ORDER — HYDRALAZINE HCL 25 MG PO TABS
25.0000 mg | ORAL_TABLET | Freq: Three times a day (TID) | ORAL | 3 refills | Status: DC
  Filled 2021-05-09: qty 90, 30d supply, fill #0
  Filled 2021-06-03: qty 90, 30d supply, fill #1
  Filled 2021-07-04: qty 90, 30d supply, fill #2
  Filled 2021-08-01: qty 90, 30d supply, fill #3

## 2021-05-09 MED ORDER — ISOSORBIDE MONONITRATE ER 30 MG PO TB24
30.0000 mg | ORAL_TABLET | Freq: Every day | ORAL | 3 refills | Status: DC
Start: 2021-05-09 — End: 2021-09-06
  Filled 2021-05-09: qty 30, 30d supply, fill #0
  Filled 2021-06-03: qty 30, 30d supply, fill #1
  Filled 2021-07-04: qty 30, 30d supply, fill #2
  Filled 2021-08-01: qty 30, 30d supply, fill #3

## 2021-05-09 MED ORDER — AMIODARONE HCL 200 MG PO TABS
100.0000 mg | ORAL_TABLET | Freq: Every day | ORAL | 3 refills | Status: DC
  Filled 2021-05-09: qty 15, 30d supply, fill #0
  Filled 2021-06-03: qty 15, 30d supply, fill #1
  Filled 2021-07-04: qty 15, 30d supply, fill #2
  Filled 2021-08-01: qty 15, 30d supply, fill #3

## 2021-05-09 MED ORDER — SPIRONOLACTONE 25 MG PO TABS
25.0000 mg | ORAL_TABLET | Freq: Every day | ORAL | 3 refills | Status: DC
  Filled 2021-05-09: qty 30, 30d supply, fill #0
  Filled 2021-06-03: qty 30, 30d supply, fill #1
  Filled 2021-07-04: qty 30, 30d supply, fill #2
  Filled 2021-08-01: qty 30, 30d supply, fill #3

## 2021-05-09 MED ORDER — CARVEDILOL 6.25 MG PO TABS
6.2500 mg | ORAL_TABLET | Freq: Two times a day (BID) | ORAL | 3 refills | Status: DC
Start: 2021-05-09 — End: 2021-09-06
  Filled 2021-05-09: qty 60, 30d supply, fill #0
  Filled 2021-06-03: qty 60, 30d supply, fill #1
  Filled 2021-07-04: qty 60, 30d supply, fill #2
  Filled 2021-08-01: qty 60, 30d supply, fill #3

## 2021-05-09 MED ORDER — FUROSEMIDE 40 MG PO TABS
40.0000 mg | ORAL_TABLET | ORAL | 3 refills | Status: AC | PRN
  Filled 2021-05-09: qty 30, 30d supply, fill #0

## 2021-05-09 NOTE — Patient Instructions (Signed)
Medication Changes:  We have sent refills for your cardiac medications (except Sherryll Burger and Jardiance) to the [facility] under our fund  Please let us know when you need a refill for Jardiance and we can send it in for you  We will submit an application for patient assistance for Entresto, PLEASE get [place] your proof of income asap to send in with your application, if you are running low on this before it is approved let us know and we can provide samples  Lab Work:  Labs done today, your results will be available in MyChart, we will contact you for abnormal readings.  Testing/Procedures:  Your physician has requested that you have an echocardiogram. Echocardiography is a painless test that uses sound waves to create images of your heart. It provides your doctor with information about the size and shape of your heart and how well your hearts chambers and valves are working. This procedure takes approximately one hour. There are no restrictions for this procedure. IN 4 MONTHS  Referrals:  NONE  Special Instructions // Education:  Do the following things EVERYDAY: Weigh yourself in the morning before breakfast. Write it down and keep it in a log. Take your medicines as prescribed Eat low salt foods--Limit salt (sodium) to 2000 mg per day.  Stay as active as you can everyday Limit all fluids for the day to less than 2 liters  Follow-Up in: 4 months with echocardiogram  At the [facility], you and your health needs are our priority. We have a designated team specialized in the treatment of Heart Failure. This Care Team includes your primary Heart Failure Specialized Cardiologist (physician), Advanced Practice Providers (APPs- Physician Assistants and Nurse Practitioners), and Pharmacist who all work together to provide you with the care you need, when you need it.   You may see any of the following providers on your designated Care Team at your next  follow up:  Dr Arvilla Meres Dr Carron Curie, NP Robbie Lis, [place] [facility] [place], [place] Karle Plumber, PharmD   Please be sure to bring in all your medications bottles to every appointment.   Need to Contact us:  If you have any questions or concerns before your next appointment please send [place] a message through [place] or call our office at [phone].    TO LEAVE A MESSAGE FOR THE NURSE SELECT OPTION 2, PLEASE LEAVE A MESSAGE INCLUDING: YOUR NAME DATE OF BIRTH CALL BACK NUMBER REASON FOR CALL**this is important as we prioritize the call backs  YOU WILL RECEIVE A CALL BACK THE SAME DAY AS LONG AS YOU CALL BEFORE 4:00 PM

## 2021-05-09 NOTE — Progress Notes (Signed)
CSW consulted to speak with pt about current lack of insurance.  Pt had [employer] insurance last year but reports it was unaffordable this year.  States that he was informed he will be eligible for Medicare starting in May.  CSW provided him with number for SHIP so he can speak with a counselor about this process and they can help him pick a plan that works for him as well as apply for Extra Help program.  CSW provided pt with CAFA to help with [facility] bills during this interim period without insurance.  Will continue to follow and assist as needed  Burna Sis, LCSW Clinical Social Worker [facility] Desk#: [phone] Cell#: [phone]

## 2021-05-09 NOTE — Progress Notes (Signed)
PCP: [facility].  Primary Cardiologist: Dr Aundra Dubin   HPI: Logan Lopez is a 66 y.o. with a history of iron deficient anemia, former smoker, chronic systolic heart failure, and PVCs.   Admitted 03/2019 increased dyspnea. ECHO showed reduced EF 25-30%. LHC/RHC completed and showed low cardiac ouput/index and no significant coronary disease. While hospitalized he had high PVC burden so amiodarone was started. Discharged with Lifevest.   Echo in 2/21 showed EF 30-35%, diffuse hypokinesis, mildly decreased RV systolic function.  Zio patch monitor in 3/21 showed no significant arrhythmias.  He had Medtronic ICD placed in 5/21.   Zio patch in 12/21 off amiodarone showed 9.7% PVCs, amiodarone was restarted.   Echo in 5/22 showed EF 45% with normal RV.   He returns for followup of CHF.  He has been doing well symptomatically.  No significant exertional dyspnea or chest pain.  BP is low today, but he denies lightheadedness.  No orthopnea/PND.  Rarely takes Lasix. Weight down 2 lbs.  Occasional palpitations noted while lying in bed.   Labs (1/21): digoxin 0.8, TSH normal, K 4.1, creatinine 0.85, AST 47, ALT 51 Labs (4/21): LFTs, TSH normal Labs (5/21): K 4.5, creatinine 1.16 Labs (7/21): digoxin 0.8, K 4.1, creatinine 1.07 Labs (11/21): digoxin 0.8, K 3.9, creatinine 0.96 Labs (2/22): digoxin 0.6, TSH normal, K 4.1, creatinine 0.91, LFTs normal Labs (8/22): K 3.7, creatinine 1.01  SH: Stopped drinking in 11/20. Worked at [employer] in the  [employer]. Lives with his wife. Has quit smoking.      PMH 1. Fe deficiency anemia.  2. Tobacco Abuse 3. Chronic systolic CHF: Nonischemic cardiomyopathy.  - 03/10/19 ECHO EF 25-30%  - RHC/LHC (11/20): mean RA 3, PA 41/21, mean PCWP 17, CI 1.9, PVR 3.75 WU; no significant coronary disease.  - Echo (2/21): EF 30-35%, diffuse hypokinesis, mildly decreased RV systolic function.  - Medtronic ICD - Cardiac MRI (9/21) was  uninterpretable due to ICD artifact.  - Echo (5/22): EF 45% with normal RV. 4. PVCs: frequent.   - Zio patch monitor in 3/21 on amiodarone showed no significant arrhythmias.  - Zio patch monitor 12/21 with 9.7% PVCs (off amiodarone).  5. HTN  ROS: All systems negative except as listed in HPI, PMH and Problem List.  FH:  Family History  Problem Relation Age of Onset   Hypertension Mother    Kidney disease Mother     Current Outpatient Medications  Medication Sig Dispense Refill   acetaminophen (TYLENOL) 500 MG tablet Take 1,000 mg by mouth every 6 (six) hours as needed for mild pain.     empagliflozin (JARDIANCE) 10 MG TABS tablet Take 1 tablet (10 mg total) by mouth daily before breakfast. 30 tablet 3   sildenafil (VIAGRA) 50 MG tablet Take 1 tablet (50 mg total) by mouth daily as needed for erectile dysfunction. DO NOT TAKE IMDUR ON DAYS YOU TAKE VIAGRA OR NEXT DAY 10 tablet 0   amiodarone (PACERONE) 200 MG tablet Take 0.5 tablets (100 mg total) by mouth daily. 15 tablet 3   carvedilol (COREG) 6.25 MG tablet Take 1 tablet (6.25 mg total) by mouth 2 (two) times daily with a meal. 60 tablet 3   furosemide (LASIX) 40 MG tablet Take 1 tablet (40 mg total) by mouth as needed. 30 tablet 3   hydrALAZINE (APRESOLINE) 25 MG tablet Take 1 tablet (25 mg total) by mouth 3 (three) times daily. 90 tablet 3   isosorbide mononitrate (IMDUR) 30 MG  24 hr tablet Take 1 tablet (30 mg total) by mouth daily. 30 tablet 3   sacubitril-valsartan (ENTRESTO) 49-51 MG Take 1 tablet by mouth 2 (two) times daily. 180 tablet 3   spironolactone (ALDACTONE) 25 MG tablet Take 1 tablet (25 mg total) by mouth daily. 30 tablet 3   No current facility-administered medications for this encounter.    Vitals:   05/09/21 1418  BP: 92/60  Pulse: 72  SpO2: 98%  Weight: 67 kg (147 lb 9.6 oz)   Wt Readings from Last 3 Encounters:  05/09/21 67 kg (147 lb 9.6 oz)  11/22/20 67 kg (147 lb 9.6 oz)  09/14/20 67.8 kg (149 lb  6.4 oz)    PHYSICAL EXAM: General: NAD Neck: No JVD, no thyromegaly or thyroid nodule.  Lungs: Clear to auscultation bilaterally with normal respiratory effort. CV: Nondisplaced PMI.  Heart regular S1/S2, no S3/S4, no murmur.  No peripheral edema.  No carotid bruit.  Normal pedal pulses.  Abdomen: Soft, nontender, no hepatosplenomegaly, no distention.  Skin: Intact without lesions or rashes.  Neurologic: Alert and oriented x 3.  Psych: Normal affect. Extremities: No clubbing or cyanosis.  HEENT: Normal.   ASSESSMENT & PLAN: 1. Chronic Systolic CHF: Nonischemic cardiomyopathy, echo 03/10/19 with EF 25-30%, RV ok.  No family history of cardiomyopathy.  No significant coronary disease on cath in 11/20.  No definite pre-existing viral symptoms.  It is possible that this could be a PVC-mediated CMP given frequent PVCs at the time of diagnosis (or that the cardiomyopathy itself could be driving the PVCs).  RHC in 11/20 showed low cardiac index at 1.8. CT chest in 11/20 with no evidence for sarcoid. Echo in 2/21 showed that EF remains 25-30%. MDT ICD placed in 5/21. Echo in 5/22 with EF up to 45%. NYHA class II symptoms, he is not volume overloaded by exam. Not lightheaded but no BP room to increase cardiac medications.  - Continue Farxiga 10 mg daily.   - Continue Coreg 6.25 mg bid.  - Continue hydralazine  25 mg three times a day + 30 mg Imdur daily.  - Continue spiro to 25 mg daily. - Continue Entresto 49/51 bid.   - BMET today.  - Repeat echo at followup in 4 months.  2. PVCs/NSVT: Very frequent when initially diagnosed in 11/20.  ?Cause of cardiomyopathy versus result of cardiomyopathy.  Decreased PVCs on amiodarone => Zio patch on amiodarone 100 mg daily showed no arrhythmias.  He now has a MDT ICD.  Amiodarone was stopped and 12/21 Zio patch was done, showing PVCs 9.7%.  Amiodarone was restarted.   - Continue amiodarone 100 mg daily, check LFTs and TSH.  He will need a regular eye exam.    Followup in 4 months with echo  Loralie Champagne 05/09/2021

## 2021-05-09 NOTE — Progress Notes (Signed)
Form completed and signed by patient for [employer] pt assist, pt is aware to bring in POI asap, he has a months worth at home already and will let us know if samples are needed before running out if not approved.  Pt has a 3 month supply of Jardiance he just got in Dec, he will let us know when he needs refills and we will send in under HF FUND to [facility] pharmacy  All other cardiac meds sent to [facility] outpatient pharmacy under HF FUND

## 2021-05-10 NOTE — Addendum Note (Signed)
Encounter addended by: Noralee Space, RN on: 05/10/2021 11:06 AM  Actions taken: Order list changed, Diagnosis association updated

## 2021-05-24 ENCOUNTER — Other Ambulatory Visit (HOSPITAL_COMMUNITY): Payer: Self-pay

## 2021-06-03 ENCOUNTER — Other Ambulatory Visit (HOSPITAL_COMMUNITY): Payer: Self-pay

## 2021-06-03 MED ORDER — ENTRESTO 49-51 MG PO TABS: 1.0000 | ORAL_TABLET | Freq: Two times a day (BID) | ORAL | 3 refills | Status: DC

## 2021-06-22 ENCOUNTER — Ambulatory Visit (INDEPENDENT_AMBULATORY_CARE_PROVIDER_SITE_OTHER): Payer: 59

## 2021-06-22 DIAGNOSIS — I428 Other cardiomyopathies: Secondary | ICD-10-CM

## 2021-06-23 LAB — CUP PACEART REMOTE DEVICE CHECK
Battery Remaining Longevity: 110 mo
Battery Voltage: 3.01 V
Brady Statistic AP VP Percent: 0.02 %
Brady Statistic AP VS Percent: 27.27 %
Brady Statistic AS VP Percent: 0.02 %
Brady Statistic AS VS Percent: 72.69 %
Brady Statistic RA Percent Paced: 27.28 %
Brady Statistic RV Percent Paced: 0.04 %
Date Time Interrogation Session: 20230223105052
HighPow Impedance: 77 Ohm
Implantable Lead Implant Date: 20210526
Implantable Lead Implant Date: 20210526
Implantable Lead Location: 753859
Implantable Lead Location: 753860
Implantable Lead Model: 5076
Implantable Pulse Generator Implant Date: 20210526
Lead Channel Impedance Value: 437 Ohm
Lead Channel Impedance Value: 456 Ohm
Lead Channel Impedance Value: 532 Ohm
Lead Channel Pacing Threshold Amplitude: 0.375 V
Lead Channel Pacing Threshold Amplitude: 0.625 V
Lead Channel Pacing Threshold Pulse Width: 0.4 ms
Lead Channel Pacing Threshold Pulse Width: 0.4 ms
Lead Channel Sensing Intrinsic Amplitude: 4.375 mV
Lead Channel Sensing Intrinsic Amplitude: 4.375 mV
Lead Channel Sensing Intrinsic Amplitude: 6.25 mV
Lead Channel Sensing Intrinsic Amplitude: 6.25 mV
Lead Channel Setting Pacing Amplitude: 1.5 V
Lead Channel Setting Pacing Amplitude: 2.5 V
Lead Channel Setting Pacing Pulse Width: 0.4 ms
Lead Channel Setting Sensing Sensitivity: 0.3 mV

## 2021-06-29 NOTE — Progress Notes (Signed)
Remote ICD transmission.   

## 2021-07-04 ENCOUNTER — Other Ambulatory Visit (HOSPITAL_COMMUNITY): Payer: Self-pay

## 2021-08-01 ENCOUNTER — Encounter (HOSPITAL_COMMUNITY): Payer: Self-pay

## 2021-08-01 ENCOUNTER — Other Ambulatory Visit (HOSPITAL_COMMUNITY): Payer: Self-pay

## 2021-08-01 ENCOUNTER — Telehealth (HOSPITAL_COMMUNITY): Payer: Self-pay | Admitting: Pharmacy Technician

## 2021-08-01 ENCOUNTER — Other Ambulatory Visit (HOSPITAL_COMMUNITY): Payer: Self-pay | Admitting: *Deleted

## 2021-08-01 MED ORDER — ENTRESTO 49-51 MG PO TABS
1.0000 | ORAL_TABLET | Freq: Two times a day (BID) | ORAL | 3 refills | Status: DC
Start: 2021-08-01 — End: 2021-11-02
  Filled 2021-08-01: qty 180, 90d supply, fill #0

## 2021-08-01 NOTE — Progress Notes (Unsigned)
Medication Samples have been provided to the patient. ? ?Drug name: Jardiance       Strength: 10 mg        Qty: 4  LOT: [id number]  Exp.Date: 05/2023 ? ?Dosing instructions: Take 1 tablet daily ? ?The patient has been instructed regarding the correct time, dose, and frequency of taking this medication, including desired effects and most common side effects.  ? ?Logan Lopez Logan Lopez ?4:13 PM ?08/01/2021 ? ?

## 2021-08-01 NOTE — Telephone Encounter (Signed)
Advanced Heart Failure Patient Advocate Encounter ? ?Patient's wife called and left message requesting samples of Entresto and Jardiance. Upon further review, patient's insurance was not paying anything towards Sherryll Burger or Jardiance at the pharmacy. I was able to get the patient a $10 co-pay card to cover the [facility]. Sent 90 day RX request to [facility] to send to [facility] outpatient. Co-pay card information entered into [place].  ? ?Even with a co-pay card the Jardiance would be too expensive for the patient. Waylan Boga (RN) a request for a month of samples. Patient's wife states that his medicare will go into effect in May.   ? ?Archer Asa, CPhT ? ?

## 2021-09-06 ENCOUNTER — Ambulatory Visit (HOSPITAL_BASED_OUTPATIENT_CLINIC_OR_DEPARTMENT_OTHER)
Admission: RE | Admit: 2021-09-06 | Discharge: 2021-09-06 | Disposition: A | Discharge status: Home / Self Care | Payer: Medicare Other | Source: Ambulatory Visit | Attending: Cardiology | Admitting: Cardiology

## 2021-09-06 ENCOUNTER — Ambulatory Visit (HOSPITAL_COMMUNITY)
Admission: RE | Admit: 2021-09-06 | Discharge: 2021-09-06 | Disposition: A | Discharge status: Home / Self Care | Payer: Medicare Other | Source: Ambulatory Visit | Attending: Cardiology | Admitting: Cardiology

## 2021-09-06 VITALS — BP 128/72 | HR 62 | Wt 146.0 lb

## 2021-09-06 DIAGNOSIS — Z95810 Presence of automatic (implantable) cardiac defibrillator: Secondary | ICD-10-CM | POA: Insufficient documentation

## 2021-09-06 DIAGNOSIS — Z7984 Long term (current) use of oral hypoglycemic drugs: Secondary | ICD-10-CM | POA: Insufficient documentation

## 2021-09-06 DIAGNOSIS — Z79899 Other long term (current) drug therapy: Secondary | ICD-10-CM | POA: Insufficient documentation

## 2021-09-06 DIAGNOSIS — I493 Ventricular premature depolarization: Secondary | ICD-10-CM | POA: Insufficient documentation

## 2021-09-06 DIAGNOSIS — I110 Hypertensive heart disease with heart failure: Secondary | ICD-10-CM | POA: Insufficient documentation

## 2021-09-06 DIAGNOSIS — Z87891 Personal history of nicotine dependence: Secondary | ICD-10-CM | POA: Diagnosis not present

## 2021-09-06 DIAGNOSIS — I472 Ventricular tachycardia, unspecified: Secondary | ICD-10-CM | POA: Insufficient documentation

## 2021-09-06 DIAGNOSIS — I5022 Chronic systolic (congestive) heart failure: Secondary | ICD-10-CM | POA: Insufficient documentation

## 2021-09-06 DIAGNOSIS — I428 Other cardiomyopathies: Secondary | ICD-10-CM | POA: Diagnosis present

## 2021-09-06 DIAGNOSIS — I5043 Acute on chronic combined systolic (congestive) and diastolic (congestive) heart failure: Secondary | ICD-10-CM | POA: Diagnosis not present

## 2021-09-06 LAB — COMPREHENSIVE METABOLIC PANEL
ALT: 25 U/L (ref 0–44)
AST: 32 U/L (ref 15–41)
Albumin: 3.7 g/dL (ref 3.5–5.0)
Alkaline Phosphatase: 66 U/L (ref 38–126)
Anion gap: 7 (ref 5–15)
BUN: 10 mg/dL (ref 8–23)
CO2: 25 mmol/L (ref 22–32)
Calcium: 9.6 mg/dL (ref 8.9–10.3)
Chloride: 107 mmol/L (ref 98–111)
Creatinine, Ser: 1.1 mg/dL (ref 0.61–1.24)
GFR, Estimated: >60 mL/min (ref >60)
Glucose, Bld: 94 mg/dL (ref 70–99)
Potassium: 4.9 mmol/L (ref 3.5–5.1)
Sodium: 139 mmol/L (ref 135–145)
Total Bilirubin: 0.8 mg/dL (ref 0.3–1.2)
Total Protein: 7.6 g/dL (ref 6.5–8.1)

## 2021-09-06 LAB — ECHOCARDIOGRAM COMPLETE
AR max vel: 2.77 cm2
AV Peak grad: 6.2 mmHg
Ao pk vel: 1.24 m/s
Area-P 1/2: 2.22 cm2
Calc EF: 49.6 %
P 1/2 time: 685 msec
S' Lateral: 3.4 cm
Single Plane A2C EF: 55 %
Single Plane A4C EF: 47.6 %

## 2021-09-06 LAB — TSH: TSH: 0.519 u[IU]/mL (ref 0.350–4.500)

## 2021-09-06 MED ORDER — EMPAGLIFLOZIN 10 MG PO TABS: 10.0000 mg | ORAL_TABLET | Freq: Every day | ORAL | 11 refills | Status: DC

## 2021-09-06 MED ORDER — AMIODARONE HCL 200 MG PO TABS: 100.0000 mg | ORAL_TABLET | Freq: Every day | ORAL | 11 refills | Status: DC

## 2021-09-06 MED ORDER — HYDRALAZINE HCL 25 MG PO TABS: 25.0000 mg | ORAL_TABLET | Freq: Three times a day (TID) | ORAL | 11 refills | Status: DC

## 2021-09-06 MED ORDER — CARVEDILOL 6.25 MG PO TABS: 6.2500 mg | ORAL_TABLET | Freq: Two times a day (BID) | ORAL | 11 refills | Status: DC

## 2021-09-06 MED ORDER — SPIRONOLACTONE 25 MG PO TABS: 25.0000 mg | ORAL_TABLET | Freq: Every day | ORAL | 11 refills | Status: DC

## 2021-09-06 MED ORDER — ISOSORBIDE MONONITRATE ER 30 MG PO TB24: 30.0000 mg | ORAL_TABLET | Freq: Every day | ORAL | 11 refills | Status: DC

## 2021-09-06 NOTE — Patient Instructions (Signed)
There has been no changes to your medications. ? ?Labs done today, your results will be available in MyChart, we will contact you for abnormal readings. ? ?Your physician recommends that you schedule a follow-up appointment in: 6 months (November 2023)  **please call the office in September 2023 to arrange your follow up appointment ** ? ?If you have any questions or concerns before your next appointment please send [place] a message through [place] or call our office at [phone].   ? ?TO LEAVE A MESSAGE FOR THE NURSE SELECT OPTION 2, PLEASE LEAVE A MESSAGE INCLUDING: ?YOUR NAME ?DATE OF BIRTH ?CALL BACK NUMBER ?REASON FOR CALL**this is important as we prioritize the call backs ? ?YOU WILL RECEIVE A CALL BACK THE SAME DAY AS LONG AS YOU CALL BEFORE 4:00 PM ? ?At the [facility], you and your health needs are our priority. As part of our continuing mission to provide you with exceptional heart care, we have created designated Provider Care Teams. These Care Teams include your primary Cardiologist (physician) and Advanced Practice Providers (APPs- Physician Assistants and Nurse Practitioners) who all work together to provide you with the care you need, when you need it.  ? ?You may see any of the following providers on your designated Care Team at your next follow up: ?Dr Arvilla Meres ?Dr Marca Ancona ?Tonye Becket, NP ?Robbie Lis, PA ?Jessica Milford,NP ?Anna Genre, PA ?Karle Plumber, PharmD ? ? ?Please be sure to bring in all your medications bottles to every appointment.  ? ? ?

## 2021-09-06 NOTE — Progress Notes (Signed)
?PCP: [facility].  ?Primary Cardiologist: Dr Aundra Dubin  ? ?HPI: ?Mr Skiver is a 66 y.o. with a history of iron deficient anemia, former smoker, chronic systolic heart failure, and PVCs.  ? ?Admitted 03/2019 increased dyspnea. ECHO showed reduced EF 25-30%. LHC/RHC completed and showed low cardiac ouput/index and no significant coronary disease. While hospitalized he had high PVC burden so amiodarone was started. Discharged with Lifevest.  ? ?Echo in 2/21 showed EF 30-35%, diffuse hypokinesis, mildly decreased RV systolic function.  Zio patch monitor in 3/21 showed no significant arrhythmias.  He had Medtronic ICD placed in 5/21.  ? ?Zio patch in 12/21 off amiodarone showed 9.7% PVCs, amiodarone was restarted.  ? ?Echo in 5/22 showed EF 45% with normal RV. Echo was done today and reviewed, EF 55% with normal RV, IVC normal.  ? ?He returns for followup of CHF. He has been doing well symptomatically.  No significant exertional dyspnea or chest pain.  No orthopnea/PND.  No lightheadedness or palpitations. Weight down 1 lb.  ? ?ECG (personally reviewed): NSR, right axis deviation.  ? ?MDT device interrogation: stable thoracic impedance, no AF/AT, no VT.  ? ?Labs (1/21): digoxin 0.8, TSH normal, K 4.1, creatinine 0.85, AST 47, ALT 51 ?Labs (4/21): LFTs, TSH normal ?Labs (5/21): K 4.5, creatinine 1.16 ?Labs (7/21): digoxin 0.8, K 4.1, creatinine 1.07 ?Labs (11/21): digoxin 0.8, K 3.9, creatinine 0.96 ?Labs (2/22): digoxin 0.6, TSH normal, K 4.1, creatinine 0.91, LFTs normal ?Labs (8/22): K 3.7, creatinine 1.01 ?Labs (1/23): TSH normal, K 3.6, creatinine 1.01, LFTs normal ? ?SH: Stopped drinking in 11/20. Worked at [employer] in the  [employer]. Lives with his wife. Has quit smoking.    ? ? PMH ?1. Fe deficiency anemia.  ?2. Tobacco Abuse ?3. Chronic systolic CHF: Nonischemic cardiomyopathy.  ?- 03/10/19 ECHO EF 25-30%  ?- RHC/LHC (11/20): mean RA 3, PA 41/21, mean PCWP 17, CI 1.9, PVR 3.75 WU;  no significant coronary disease.  ?- Echo (2/21): EF 30-35%, diffuse hypokinesis, mildly decreased RV systolic function.  ?- Medtronic ICD ?- Cardiac MRI (9/21) was uninterpretable due to ICD artifact.  ?- Echo (5/22): EF 45% with normal RV. ?- Echo (5/23): EF 55% with normal RV, IVC normal. ?4. PVCs: frequent.   ?- Zio patch monitor in 3/21 on amiodarone showed no significant arrhythmias.  ?- Zio patch monitor 12/21 with 9.7% PVCs (off amiodarone).  ?5. HTN ? ?ROS: All systems negative except as listed in HPI, PMH and Problem List. ? ?FH:  ?Family History  ?Problem Relation Age of Onset  ? Hypertension Mother   ? Kidney disease Mother   ? ? ?Current Outpatient Medications  ?Medication Sig Dispense Refill  ? acetaminophen (TYLENOL) 500 MG tablet Take 1,000 mg by mouth every 6 (six) hours as needed for mild pain.    ? furosemide (LASIX) 40 MG tablet Take 1 tablet (40 mg total) by mouth as needed. 30 tablet 3  ? sacubitril-valsartan (ENTRESTO) 49-51 MG Take 1 tablet by mouth 2 (two) times daily. 180 tablet 3  ? sildenafil (VIAGRA) 50 MG tablet Take 1 tablet (50 mg total) by mouth daily as needed for erectile dysfunction. DO NOT TAKE IMDUR ON DAYS YOU TAKE VIAGRA OR NEXT DAY 10 tablet 0  ? amiodarone (PACERONE) 200 MG tablet Take 0.5 tablets (100 mg total) by mouth daily. 15 tablet 11  ? carvedilol (COREG) 6.25 MG tablet Take 1 tablet (6.25 mg total) by mouth 2 (two) times daily with a  meal. 60 tablet 11  ? empagliflozin (JARDIANCE) 10 MG TABS tablet Take 1 tablet (10 mg total) by mouth daily before breakfast. 30 tablet 11  ? hydrALAZINE (APRESOLINE) 25 MG tablet Take 1 tablet (25 mg total) by mouth 3 (three) times daily. 90 tablet 11  ? isosorbide mononitrate (IMDUR) 30 MG 24 hr tablet Take 1 tablet (30 mg total) by mouth daily. 30 tablet 11  ? spironolactone (ALDACTONE) 25 MG tablet Take 1 tablet (25 mg total) by mouth daily. 30 tablet 11  ? ?No current facility-administered medications for this encounter.   ? ? ?Vitals:  ? 09/06/21 1449  ?BP: 128/72  ?Pulse: 62  ?SpO2: 98%  ?Weight: 66.2 kg (146 lb)  ? ?Wt Readings from Last 3 Encounters:  ?09/06/21 66.2 kg (146 lb)  ?05/09/21 67 kg (147 lb 9.6 oz)  ?11/22/20 67 kg (147 lb 9.6 oz)  ? ? ?PHYSICAL EXAM: ?General: NAD ?Neck: No JVD, no thyromegaly or thyroid nodule.  ?Lungs: Clear to auscultation bilaterally with normal respiratory effort. ?CV: Nondisplaced PMI.  Heart regular S1/S2, no S3/S4, no murmur.  No peripheral edema.  No carotid bruit.  Normal pedal pulses.  ?Abdomen: Soft, nontender, no hepatosplenomegaly, no distention.  ?Skin: Intact without lesions or rashes.  ?Neurologic: Alert and oriented x 3.  ?Psych: Normal affect. ?Extremities: No clubbing or cyanosis.  ?HEENT: Normal.  ? ?ASSESSMENT & PLAN: ?1. Chronic Systolic CHF: Nonischemic cardiomyopathy, echo 03/10/19 with EF 25-30%, RV ok.  No family history of cardiomyopathy.  No significant coronary disease on cath in 11/20.  No definite pre-existing viral symptoms.  It is possible that this could be a PVC-mediated CMP given frequent PVCs at the time of diagnosis (or that the cardiomyopathy itself could be driving the PVCs).  RHC in 11/20 showed low cardiac index at 1.8. CT chest in 11/20 with no evidence for sarcoid. Echo in 2/21 showed that EF remains 25-30%. MDT ICD placed in 5/21. Echo in 5/22 with EF up to 45%. Echo today showed EF 55% with normal RV, IVC normal.  NYHA class I-II symptoms, he is not volume overloaded by exam.  ?- Continue Farxiga 10 mg daily.   ?- Continue Coreg 6.25 mg bid.  ?- Continue hydralazine 25 mg three times a day + 30 mg Imdur daily.  ?- Continue spiro to 25 mg daily.  BMET today.  ?- Continue Entresto 49/51 bid.   ?2. PVCs/NSVT: Very frequent when initially diagnosed in 11/20.  ?Cause of cardiomyopathy versus result of cardiomyopathy.  Decreased PVCs on amiodarone => Zio patch on amiodarone 100 mg daily showed no arrhythmias.  He now has a MDT ICD.  Amiodarone was stopped and  12/21 Zio patch was done, showing PVCs 9.7%.  Amiodarone was restarted.   ?- Continue amiodarone 100 mg daily, check LFTs and TSH.  He will need a regular eye exam.  ? ?CMET in 3 months, followup with APP in 6 months.  ? ?Loralie Champagne ?09/06/2021 ? ?

## 2021-09-07 ENCOUNTER — Other Ambulatory Visit (HOSPITAL_COMMUNITY): Payer: Self-pay | Admitting: *Deleted

## 2021-09-07 MED ORDER — EMPAGLIFLOZIN 10 MG PO TABS: 10.0000 mg | ORAL_TABLET | Freq: Every day | ORAL | 11 refills | Status: DC

## 2021-09-21 ENCOUNTER — Ambulatory Visit (INDEPENDENT_AMBULATORY_CARE_PROVIDER_SITE_OTHER): Payer: Medicare Other

## 2021-09-21 DIAGNOSIS — I428 Other cardiomyopathies: Secondary | ICD-10-CM | POA: Diagnosis not present

## 2021-09-21 LAB — CUP PACEART REMOTE DEVICE CHECK
Battery Remaining Longevity: 107 mo
Battery Voltage: 3.02 V
Brady Statistic AP VP Percent: 0.01 %
Brady Statistic AP VS Percent: 10.91 %
Brady Statistic AS VP Percent: 0.03 %
Brady Statistic AS VS Percent: 89.05 %
Brady Statistic RA Percent Paced: 10.92 %
Brady Statistic RV Percent Paced: 0.04 %
Date Time Interrogation Session: 20230524043724
HighPow Impedance: 75 Ohm
Implantable Lead Implant Date: 20210526
Implantable Lead Implant Date: 20210526
Implantable Lead Location: 753859
Implantable Lead Location: 753860
Implantable Lead Model: 5076
Implantable Pulse Generator Implant Date: 20210526
Lead Channel Impedance Value: 399 Ohm
Lead Channel Impedance Value: 456 Ohm
Lead Channel Impedance Value: 532 Ohm
Lead Channel Pacing Threshold Amplitude: 0.375 V
Lead Channel Pacing Threshold Amplitude: 0.625 V
Lead Channel Pacing Threshold Pulse Width: 0.4 ms
Lead Channel Pacing Threshold Pulse Width: 0.4 ms
Lead Channel Sensing Intrinsic Amplitude: 3.875 mV
Lead Channel Sensing Intrinsic Amplitude: 3.875 mV
Lead Channel Sensing Intrinsic Amplitude: 6.5 mV
Lead Channel Sensing Intrinsic Amplitude: 6.5 mV
Lead Channel Setting Pacing Amplitude: 1.5 V
Lead Channel Setting Pacing Amplitude: 2.5 V
Lead Channel Setting Pacing Pulse Width: 0.4 ms
Lead Channel Setting Sensing Sensitivity: 0.3 mV

## 2021-10-04 NOTE — Progress Notes (Signed)
Remote ICD transmission.   

## 2021-11-02 ENCOUNTER — Other Ambulatory Visit (HOSPITAL_COMMUNITY): Payer: Self-pay | Admitting: *Deleted

## 2021-11-02 MED ORDER — ENTRESTO 49-51 MG PO TABS: 1.0000 | ORAL_TABLET | Freq: Two times a day (BID) | ORAL | 3 refills | Status: DC

## 2021-12-21 ENCOUNTER — Ambulatory Visit (INDEPENDENT_AMBULATORY_CARE_PROVIDER_SITE_OTHER): Payer: Medicare Other

## 2021-12-21 DIAGNOSIS — I428 Other cardiomyopathies: Secondary | ICD-10-CM

## 2021-12-22 LAB — CUP PACEART REMOTE DEVICE CHECK
Battery Remaining Longevity: 104 mo
Battery Voltage: 3.01 V
Brady Statistic AP VP Percent: 0.01 %
Brady Statistic AP VS Percent: 21.89 %
Brady Statistic AS VP Percent: 0.03 %
Brady Statistic AS VS Percent: 78.08 %
Brady Statistic RA Percent Paced: 21.89 %
Brady Statistic RV Percent Paced: 0.04 %
Date Time Interrogation Session: 20230824111556
HighPow Impedance: 79 Ohm
Implantable Lead Implant Date: 20210526
Implantable Lead Implant Date: 20210526
Implantable Lead Location: 753859
Implantable Lead Location: 753860
Implantable Lead Model: 5076
Implantable Pulse Generator Implant Date: 20210526
Lead Channel Impedance Value: 399 Ohm
Lead Channel Impedance Value: 456 Ohm
Lead Channel Impedance Value: 532 Ohm
Lead Channel Pacing Threshold Amplitude: 0.5 V
Lead Channel Pacing Threshold Amplitude: 0.625 V
Lead Channel Pacing Threshold Pulse Width: 0.4 ms
Lead Channel Pacing Threshold Pulse Width: 0.4 ms
Lead Channel Sensing Intrinsic Amplitude: 3.875 mV
Lead Channel Sensing Intrinsic Amplitude: 3.875 mV
Lead Channel Sensing Intrinsic Amplitude: 5.625 mV
Lead Channel Sensing Intrinsic Amplitude: 5.625 mV
Lead Channel Setting Pacing Amplitude: 1.5 V
Lead Channel Setting Pacing Amplitude: 2.5 V
Lead Channel Setting Pacing Pulse Width: 0.4 ms
Lead Channel Setting Sensing Sensitivity: 0.3 mV

## 2022-01-17 NOTE — Progress Notes (Signed)
Remote ICD transmission.   

## 2022-01-30 ENCOUNTER — Ambulatory Visit: Payer: Medicare Other | Attending: Cardiology | Admitting: Cardiology

## 2022-01-30 ENCOUNTER — Encounter: Payer: Self-pay | Admitting: Cardiology

## 2022-01-30 VITALS — BP 100/58 | HR 61 | Ht 71.0 in | Wt 144.1 lb

## 2022-01-30 DIAGNOSIS — Z79899 Other long term (current) drug therapy: Secondary | ICD-10-CM | POA: Insufficient documentation

## 2022-01-30 DIAGNOSIS — I493 Ventricular premature depolarization: Secondary | ICD-10-CM | POA: Diagnosis present

## 2022-01-30 DIAGNOSIS — I5042 Chronic combined systolic (congestive) and diastolic (congestive) heart failure: Secondary | ICD-10-CM | POA: Diagnosis present

## 2022-01-30 LAB — CUP PACEART INCLINIC DEVICE CHECK
Battery Remaining Longevity: 103 mo
Battery Voltage: 3.02 V
Brady Statistic AP VP Percent: 0.01 %
Brady Statistic AP VS Percent: 21.55 %
Brady Statistic AS VP Percent: 0.03 %
Brady Statistic AS VS Percent: 78.41 %
Brady Statistic RA Percent Paced: 21.56 %
Brady Statistic RV Percent Paced: 0.04 %
Date Time Interrogation Session: 20231002160110
HighPow Impedance: 74 Ohm
Implantable Lead Implant Date: 20210526
Implantable Lead Implant Date: 20210526
Implantable Lead Location: 753859
Implantable Lead Location: 753860
Implantable Lead Model: 5076
Implantable Pulse Generator Implant Date: 20210526
Lead Channel Impedance Value: 399 Ohm
Lead Channel Impedance Value: 399 Ohm
Lead Channel Impedance Value: 494 Ohm
Lead Channel Pacing Threshold Amplitude: 0.5 V
Lead Channel Pacing Threshold Amplitude: 0.625 V
Lead Channel Pacing Threshold Pulse Width: 0.4 ms
Lead Channel Pacing Threshold Pulse Width: 0.4 ms
Lead Channel Sensing Intrinsic Amplitude: 3.75 mV
Lead Channel Sensing Intrinsic Amplitude: 4.5 mV
Lead Channel Sensing Intrinsic Amplitude: 4.875 mV
Lead Channel Sensing Intrinsic Amplitude: 5.125 mV
Lead Channel Setting Pacing Amplitude: 1.5 V
Lead Channel Setting Pacing Amplitude: 2.5 V
Lead Channel Setting Pacing Pulse Width: 0.4 ms
Lead Channel Setting Sensing Sensitivity: 0.3 mV

## 2022-01-30 NOTE — Patient Instructions (Signed)
Medication Instructions:  Your physician recommends that you continue on your current medications as directed. Please refer to the Current Medication list given to you today.  *If you need a refill on your cardiac medications before your next appointment, please call your pharmacy*   Lab Work: Today:  TSH & LFTs You can stop by the [facility] in [place] --  [address], [place], [place] [address]  If you have labs (blood work) drawn today and your tests are completely normal, you will receive your results only by: [employer] (if you have MyChart) OR A paper copy in the mail If you have any lab test that is abnormal or we need to change your treatment, we will call you to review the results.   Testing/Procedures: None ordered   Follow-Up: At [facility], you and your health needs are our priority.  As part of our continuing mission to provide you with exceptional heart care, we have created designated Provider Care Teams.  These Care Teams include your primary Cardiologist (physician) and Advanced Practice Providers (APPs -  Physician Assistants and Nurse Practitioners) who all work together to provide you with the care you need, when you need it.  Remote monitoring is used to monitor your Pacemaker or ICD from home. This monitoring reduces the number of office visits required to check your device to one time per year. It allows [place] to keep an eye on the functioning of your device to ensure it is working properly. You are scheduled for a device check from home on 03/22/2022. You may send your transmission at any time that day. If you have a wireless device, the transmission will be sent automatically. After your physician reviews your transmission, you will receive a postcard with your next transmission date.  Your next appointment:   1 year(s)  The format for your next appointment:   In Person  Provider:   Allegra Lai, MD    Thank you for choosing [facility]!!   Trinidad Curet, RN [phone]    Other Instructions  Important Information About [employer]

## 2022-01-30 NOTE — Progress Notes (Signed)
Electrophysiology Office Note   Date:  01/30/2022   ID:  Logan Lopez, Logan Lopez [birth year], MRN [id number]  PCP:  Gildardo Pounds, NP  Cardiologist:  Aundra Dubin Primary Electrophysiologist:  Jevan Gaunt Meredith Leeds, MD    Chief Complaint: CHF   History of Present Illness: Logan Lopez is a 66 y.o. male who is being seen today for the evaluation of CHF at the request of Gildardo Pounds, NP. Presenting today for electrophysiology evaluation.  He has a history significant for anemia, tobacco abuse, PVCs, chronic systolic heart failure due to nonischemic cardiomyopathy.  He was hospitalized number of 2020 with dyspnea and was found to have an ejection fraction 25 to 30%.  She had low output but no obstructive coronary disease.  He was noted to have an elevated PVC burden while he was in the hospital.  He was started on amiodarone.  He is post Medtronic dual-chamber ICD implanted 09/24/2019.  His PVC burden has been decreased and his ejection fraction has normalized.  Today, denies symptoms of palpitations, chest pain, shortness of breath, orthopnea, PND, lower extremity edema, claudication, dizziness, presyncope, syncope, bleeding, or neurologic sequela. The patient is tolerating medications without difficulties.    Past Medical History:  Diagnosis Date   CHF (congestive heart failure) ([place])    new 03/2019   Dyspnea    Hypertension    Past Surgical History:  Procedure Laterality Date   ICD IMPLANT N/A 09/24/2019   Procedure: ICD IMPLANT;  Surgeon: Constance Haw, MD;  Location: [place];  Service: Cardiovascular;  Laterality: N/A;   RIGHT/LEFT HEART CATH AND CORONARY ANGIOGRAPHY N/A 03/12/2019   Procedure: RIGHT/LEFT HEART CATH AND CORONARY ANGIOGRAPHY;  Surgeon: Larey Dresser, MD;  Location: [place] [facility];  Service: Cardiovascular;  Laterality: N/A;   WISDOM TOOTH EXTRACTION       Current Outpatient Medications  Medication Sig Dispense Refill   acetaminophen  (TYLENOL) 500 MG tablet Take 1,000 mg by mouth every 6 (six) hours as needed for mild pain.     amiodarone (PACERONE) 200 MG tablet Take 0.5 tablets (100 mg total) by mouth daily. 15 tablet 11   carvedilol (COREG) 6.25 MG tablet Take 1 tablet (6.25 mg total) by mouth 2 (two) times daily with a meal. 60 tablet 11   empagliflozin (JARDIANCE) 10 MG TABS tablet Take 1 tablet (10 mg total) by mouth daily before breakfast. 30 tablet 11   furosemide (LASIX) 40 MG tablet Take 1 tablet (40 mg total) by mouth as needed. 30 tablet 3   hydrALAZINE (APRESOLINE) 25 MG tablet Take 1 tablet (25 mg total) by mouth 3 (three) times daily. 90 tablet 11   isosorbide mononitrate (IMDUR) 30 MG 24 hr tablet Take 1 tablet (30 mg total) by mouth daily. 30 tablet 11   sacubitril-valsartan (ENTRESTO) 49-51 MG Take 1 tablet by mouth 2 (two) times daily. 180 tablet 3   spironolactone (ALDACTONE) 25 MG tablet Take 1 tablet (25 mg total) by mouth daily. 30 tablet 11   No current facility-administered medications for this visit.    Allergies:   Penicillins   Social History:  The patient  reports that he has quit smoking. His smoking use included cigarettes. He has never used smokeless tobacco. He reports that he does not currently use alcohol. He reports that he does not use drugs.   Family History:   No family history of heart disease.  Brothers are all healthy.  Otherwise he is unaware of  medical issues in his family.  ROS:  Please see the history of present illness.   Otherwise, review of systems is positive for none.   All other systems are reviewed and negative.   PHYSICAL EXAM: VS:  BP (!) 100/58   Pulse 61   Ht 5\' 11"  (1.803 m)   Wt 144 lb 1.9 oz (65.4 kg)   SpO2 95%   BMI 20.10 kg/m  , BMI Body mass index is 20.1 kg/m. GEN: Well nourished, well developed, in no acute distress  HEENT: normal  Neck: no JVD, carotid bruits, or masses Cardiac: RRR; no murmurs, rubs, or gallops,no edema  Respiratory:  clear to  auscultation bilaterally, normal work of breathing GI: soft, nontender, nondistended, + BS MS: no deformity or atrophy  Skin: warm and dry, device site well healed Neuro:  Strength and sensation are intact Psych: euthymic mood, full affect  EKG:  EKG is ordered today. Personal review of the ekg ordered shows sinus rhythm, rate 61  Personal review of the device interrogation today. Results in [facility]   Recent Labs: 09/06/2021: ALT 25; BUN 10; Creatinine, Ser 1.10; Potassium 4.9; Sodium 139; TSH 0.519    Lipid Panel     Component Value Date/Time   CHOL 134 03/12/2019 0340   TRIG 81 03/12/2019 0340   HDL 28 (L) 03/12/2019 0340   CHOLHDL 4.8 03/12/2019 0340   VLDL 16 03/12/2019 0340   LDLCALC 90 03/12/2019 0340     Wt Readings from Last 3 Encounters:  01/30/22 144 lb 1.9 oz (65.4 kg)  09/06/21 146 lb (66.2 kg)  05/09/21 147 lb 9.6 oz (67 kg)      Other studies Reviewed: Additional studies/ records that were reviewed today include: TTE 09/06/2021 Review of the above records today demonstrates:   1. Left ventricular ejection fraction, by estimation, is 55%. The left  ventricle has normal function. The left ventricle has no regional wall  motion abnormalities. Left ventricular diastolic parameters are consistent  with Grade I diastolic dysfunction  (impaired relaxation).   2. Right ventricular systolic function is normal. The right ventricular  size is normal. There is normal pulmonary artery systolic pressure. The  estimated right ventricular systolic pressure is 25.8 mmHg.   3. The mitral valve is normal in structure. Trivial mitral valve  regurgitation. No evidence of mitral stenosis.   4. The aortic valve is tricuspid. Aortic valve regurgitation is trivial.  No aortic stenosis is present.   5. Aortic dilatation noted. There is mild dilatation of the aortic root,  measuring 40 mm.   6. The inferior vena cava is normal in size with greater than 50%  respiratory  variability, suggesting right atrial pressure of 3 mmHg.   RHC/LHC 03/12/19 1. Mild elevation in PCWP, normal RA pressure.  2. Mild primarily pulmonary venous hypertension.  3. Low cardiac output, CI 1.8.  4. No significant coronary disease, nonischemic cardiomyopathy.   Monitor 07/13/19 personally reviewed NSR.  Relatively normal study with no significant arrhythmia noted.   ASSESSMENT AND PLAN:  1.  Chronic systolic heart failure: Due to nonischemic cardiomyopathy.  Ejection fraction is improved to 55%.  Currently on optimal medical therapy.  He is status post Medtronic dual-chamber ICD implanted 09/24/2019.  Device functioning appropriately.  No changes at this time.  2.  PVCs: Currently on amiodarone 100 mg daily.  High risk medication monitoring for amiodarone with labs drawn today.  He has minimal PVCs on his current dose.  We Aviel Davalos continue with current  management.   Current medicines are reviewed at length with the patient today.   The patient does not have concerns regarding his medicines.  The following changes were made today: None  Labs/ tests ordered today include:  Orders Placed This Encounter  Procedures   Hepatic function panel   TSH   EKG 12-Lead      Disposition:   FU with Chasey Dull 12 months  Signed, Prue Lingenfelter Jorja Loa, MD  01/30/2022 3:03 PM     [facility] [address] [place] [address] [phone] (office) [phone] (fax)

## 2022-01-31 LAB — HEPATIC FUNCTION PANEL
ALT: 26 IU/L (ref 0–44)
AST: 31 IU/L (ref 0–40)
Albumin: 4.4 g/dL (ref 3.9–4.9)
Alkaline Phosphatase: 75 IU/L (ref 44–121)
Bilirubin Total: 0.3 mg/dL (ref 0.0–1.2)
Bilirubin, Direct: 0.14 mg/dL (ref 0.00–0.40)
Total Protein: 7.4 g/dL (ref 6.0–8.5)

## 2022-01-31 LAB — TSH: TSH: 0.332 u[IU]/mL — ABNORMAL LOW (ref 0.450–4.500)

## 2022-02-03 ENCOUNTER — Other Ambulatory Visit: Payer: Self-pay | Admitting: *Deleted

## 2022-02-03 DIAGNOSIS — E0590 Thyrotoxicosis, unspecified without thyrotoxic crisis or storm: Secondary | ICD-10-CM

## 2022-03-22 ENCOUNTER — Ambulatory Visit (INDEPENDENT_AMBULATORY_CARE_PROVIDER_SITE_OTHER): Payer: Medicare Other

## 2022-03-22 DIAGNOSIS — I428 Other cardiomyopathies: Secondary | ICD-10-CM

## 2022-03-24 LAB — CUP PACEART REMOTE DEVICE CHECK
Battery Remaining Longevity: 101 mo
Battery Voltage: 3 V
Brady Statistic AP VP Percent: 0.01 %
Brady Statistic AP VS Percent: 17.19 %
Brady Statistic AS VP Percent: 0.03 %
Brady Statistic AS VS Percent: 82.77 %
Brady Statistic RA Percent Paced: 17.19 %
Brady Statistic RV Percent Paced: 0.04 %
Date Time Interrogation Session: 20231123134531
HighPow Impedance: 80 Ohm
Implantable Lead Connection Status: 753985
Implantable Lead Connection Status: 753985
Implantable Lead Implant Date: 20210526
Implantable Lead Implant Date: 20210526
Implantable Lead Location: 753859
Implantable Lead Location: 753860
Implantable Lead Model: 5076
Implantable Pulse Generator Implant Date: 20210526
Lead Channel Impedance Value: 399 Ohm
Lead Channel Impedance Value: 437 Ohm
Lead Channel Impedance Value: 513 Ohm
Lead Channel Pacing Threshold Amplitude: 0.5 V
Lead Channel Pacing Threshold Amplitude: 0.625 V
Lead Channel Pacing Threshold Pulse Width: 0.4 ms
Lead Channel Pacing Threshold Pulse Width: 0.4 ms
Lead Channel Sensing Intrinsic Amplitude: 4.25 mV
Lead Channel Sensing Intrinsic Amplitude: 4.25 mV
Lead Channel Sensing Intrinsic Amplitude: 7.25 mV
Lead Channel Sensing Intrinsic Amplitude: 7.25 mV
Lead Channel Setting Pacing Amplitude: 1.5 V
Lead Channel Setting Pacing Amplitude: 2.5 V
Lead Channel Setting Pacing Pulse Width: 0.4 ms
Lead Channel Setting Sensing Sensitivity: 0.3 mV
Zone Setting Status: 755011
Zone Setting Status: 755011

## 2022-04-12 NOTE — Progress Notes (Signed)
Remote ICD transmission.   

## 2022-05-15 ENCOUNTER — Ambulatory Visit: Payer: Medicare Other | Attending: Cardiology

## 2022-05-15 DIAGNOSIS — E0590 Thyrotoxicosis, unspecified without thyrotoxic crisis or storm: Secondary | ICD-10-CM

## 2022-05-16 LAB — TSH: TSH: 0.473 u[IU]/mL (ref 0.450–4.500)

## 2022-06-21 ENCOUNTER — Ambulatory Visit (INDEPENDENT_AMBULATORY_CARE_PROVIDER_SITE_OTHER): Payer: Medicare Other

## 2022-06-21 DIAGNOSIS — I428 Other cardiomyopathies: Secondary | ICD-10-CM | POA: Diagnosis not present

## 2022-06-22 LAB — CUP PACEART REMOTE DEVICE CHECK
Battery Remaining Longevity: 98 mo
Battery Voltage: 3.01 V
Brady Statistic AP VP Percent: 0.02 %
Brady Statistic AP VS Percent: 40.19 %
Brady Statistic AS VP Percent: 0.02 %
Brady Statistic AS VS Percent: 59.76 %
Brady Statistic RA Percent Paced: 40.19 %
Brady Statistic RV Percent Paced: 0.04 %
Date Time Interrogation Session: 20240221211607
HighPow Impedance: 83 Ohm
Implantable Lead Connection Status: 753985
Implantable Lead Connection Status: 753985
Implantable Lead Implant Date: 20210526
Implantable Lead Implant Date: 20210526
Implantable Lead Location: 753859
Implantable Lead Location: 753860
Implantable Lead Model: 5076
Implantable Pulse Generator Implant Date: 20210526
Lead Channel Impedance Value: 399 Ohm
Lead Channel Impedance Value: 437 Ohm
Lead Channel Impedance Value: 513 Ohm
Lead Channel Pacing Threshold Amplitude: 0.5 V
Lead Channel Pacing Threshold Amplitude: 0.625 V
Lead Channel Pacing Threshold Pulse Width: 0.4 ms
Lead Channel Pacing Threshold Pulse Width: 0.4 ms
Lead Channel Sensing Intrinsic Amplitude: 4 mV
Lead Channel Sensing Intrinsic Amplitude: 4 mV
Lead Channel Sensing Intrinsic Amplitude: 6.5 mV
Lead Channel Sensing Intrinsic Amplitude: 6.5 mV
Lead Channel Setting Pacing Amplitude: 1.5 V
Lead Channel Setting Pacing Amplitude: 2.5 V
Lead Channel Setting Pacing Pulse Width: 0.4 ms
Lead Channel Setting Sensing Sensitivity: 0.3 mV
Zone Setting Status: 755011
Zone Setting Status: 755011

## 2022-07-19 NOTE — Progress Notes (Addendum)
Remote ICD transmission.   

## 2022-08-15 ENCOUNTER — Encounter (HOSPITAL_COMMUNITY): Payer: Medicare Other

## 2022-09-01 ENCOUNTER — Encounter (HOSPITAL_COMMUNITY): Payer: Self-pay

## 2022-09-01 ENCOUNTER — Ambulatory Visit (HOSPITAL_COMMUNITY)
Admission: RE | Admit: 2022-09-01 | Discharge: 2022-09-01 | Disposition: A | Discharge status: Home / Self Care | Payer: Medicare Other | Source: Ambulatory Visit | Attending: Internal Medicine | Admitting: Internal Medicine

## 2022-09-01 VITALS — BP 104/62 | HR 60 | Wt 144.8 lb

## 2022-09-01 DIAGNOSIS — Z79899 Other long term (current) drug therapy: Secondary | ICD-10-CM | POA: Insufficient documentation

## 2022-09-01 DIAGNOSIS — Z95810 Presence of automatic (implantable) cardiac defibrillator: Secondary | ICD-10-CM | POA: Insufficient documentation

## 2022-09-01 DIAGNOSIS — Z87891 Personal history of nicotine dependence: Secondary | ICD-10-CM | POA: Insufficient documentation

## 2022-09-01 DIAGNOSIS — I4729 Other ventricular tachycardia: Secondary | ICD-10-CM | POA: Diagnosis not present

## 2022-09-01 DIAGNOSIS — I110 Hypertensive heart disease with heart failure: Secondary | ICD-10-CM | POA: Insufficient documentation

## 2022-09-01 DIAGNOSIS — I428 Other cardiomyopathies: Secondary | ICD-10-CM | POA: Diagnosis not present

## 2022-09-01 DIAGNOSIS — I5022 Chronic systolic (congestive) heart failure: Secondary | ICD-10-CM | POA: Diagnosis not present

## 2022-09-01 DIAGNOSIS — I493 Ventricular premature depolarization: Secondary | ICD-10-CM

## 2022-09-01 LAB — COMPREHENSIVE METABOLIC PANEL
ALT: 21 U/L (ref 0–44)
AST: 30 U/L (ref 15–41)
Albumin: 3.7 g/dL (ref 3.5–5.0)
Alkaline Phosphatase: 98 U/L (ref 38–126)
Anion gap: 18 — ABNORMAL HIGH (ref 5–15)
BUN: 9 mg/dL (ref 8–23)
CO2: 18 mmol/L — ABNORMAL LOW (ref 22–32)
Calcium: 9.2 mg/dL (ref 8.9–10.3)
Chloride: 101 mmol/L (ref 98–111)
Creatinine, Ser: 1 mg/dL (ref 0.61–1.24)
GFR, Estimated: >60 mL/min (ref >60)
Glucose, Bld: 83 mg/dL (ref 70–99)
Potassium: 3.9 mmol/L (ref 3.5–5.1)
Sodium: 137 mmol/L (ref 135–145)
Total Bilirubin: 0.3 mg/dL (ref 0.3–1.2)
Total Protein: 7.9 g/dL (ref 6.5–8.1)

## 2022-09-01 LAB — CBC
HCT: 46.5 % (ref 39.0–52.0)
Hemoglobin: 15.3 g/dL (ref 13.0–17.0)
MCH: 31.7 pg (ref 26.0–34.0)
MCHC: 32.9 g/dL (ref 30.0–36.0)
MCV: 96.5 fL (ref 80.0–100.0)
Platelets: 153 10*3/uL (ref 150–400)
RBC: 4.82 MIL/uL (ref 4.22–5.81)
RDW: 12.8 % (ref 11.5–15.5)
WBC: 4.2 10*3/uL (ref 4.0–10.5)
nRBC: 0 % (ref 0.0–0.2)

## 2022-09-01 LAB — TSH: TSH: 0.513 u[IU]/mL (ref 0.350–4.500)

## 2022-09-01 NOTE — Patient Instructions (Addendum)
Thank you for coming in today  Labs were done today, if any labs are abnormal the clinic will call you No news is good news  Establish care with a eye doctor   Medications: No changes  Follow up appointments: Your physician has requested that you have an echocardiogram. Echocardiography is a painless test that uses sound waves to create images of your heart. It provides your doctor with information about the size and shape of your heart and how well your heart's chambers and valves are working. This procedure takes approximately one hour. There are no restrictions for this procedure.     Your physician recommends that you schedule a follow-up appointment in:  6 months With Dr. Earlean Shawl will receive a reminder letter in the mail a few months in advance. If you don't receive a letter, please call our office to schedule the follow-up appointment.     Do the following things EVERYDAY: Weigh yourself in the morning before breakfast. Write it down and keep it in a log. Take your medicines as prescribed Eat low salt foods--Limit salt (sodium) to 2000 mg per day.  Stay as active as you can everyday Limit all fluids for the day to less than 2 liters   At the [facility], you and your health needs are our priority. As part of our continuing mission to provide you with exceptional heart care, we have created designated Provider Care Teams. These Care Teams include your primary Cardiologist (physician) and Advanced Practice Providers (APPs- Physician Assistants and Nurse Practitioners) who all work together to provide you with the care you need, when you need it.   You may see any of the following providers on your designated Care Team at your next follow up: Dr Arvilla Meres Dr Marca Ancona Dr. Marcos Eke, NP Robbie Lis, [place] [facility] [place], [place] Brynda Peon, NP Karle Plumber, PharmD   Please be sure to bring in all your  medications bottles to every appointment.    Thank you for choosing [facility]  If you have any questions or concerns before your next appointment please send [place] a message through [place] or call our office at [phone].    TO LEAVE A MESSAGE FOR THE NURSE SELECT OPTION 2, PLEASE LEAVE A MESSAGE INCLUDING: YOUR NAME DATE OF BIRTH CALL BACK NUMBER REASON FOR CALL**this is important as we prioritize the call backs  YOU WILL RECEIVE A CALL BACK THE SAME DAY AS LONG AS YOU CALL BEFORE 4:00 PM

## 2022-09-01 NOTE — Progress Notes (Signed)
PCP: [facility].  Primary Cardiologist: Dr Shirlee Latch   HPI: [name] is a 67 y.o. with a history of iron deficient anemia, former smoker, chronic systolic heart failure, and PVCs.   Admitted 03/2019 increased dyspnea. ECHO showed reduced EF 25-30%. LHC/RHC completed and showed low cardiac ouput/index and no significant coronary disease. While hospitalized he had high PVC burden so amiodarone was started. Discharged with [facility].   Echo in 2/21 showed EF 30-35%, diffuse hypokinesis, mildly decreased RV systolic function.  Zio patch monitor in 3/21 showed no significant arrhythmias.  He had Medtronic ICD placed in 5/21.   Zio patch in 12/21 off amiodarone showed 9.7% PVCs, amiodarone was restarted.   Echo 5/22 EF 45% with normal RV. Echo   Echo 5/23 EF 55%, GIDD, RV normal, trivial MR, mild TR  Today he returns for AHF follow up. Overall feeling good. Denies palpitations, CP. Intt dizziness with brisk movements. Denies edema, or PND/Orthopnea. No SOB. Appetite ok does most of the cooking, eats lots of potato chips. No fever or chills. Does not weight or check BP at home. Taking all medications.  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   ECG (personally reviewed): a paced 60 bpm  MDT device interrogation: No AT/AF, 4.8 hrs/day activity, optivol and thoracic impedance stable  Labs (1/21): digoxin 0.8, TSH normal, K 4.1, creatinine 0.85, AST 47, ALT 51 Labs (4/21): LFTs, TSH normal Labs (5/21): K 4.5, creatinine 1.16 Labs (7/21): digoxin 0.8, K 4.1, creatinine 1.07 Labs (11/21): digoxin 0.8, K 3.9, creatinine 0.96 Labs (2/22): digoxin 0.6, TSH normal, K 4.1, creatinine 0.91, LFTs normal Labs (8/22): K 3.7, creatinine 1.01 Labs (1/23): TSH normal, K 3.6, creatinine 1.01, LFTs normal Labs: (5/23): K 4.9, Scr 1.1 Labs (1/24): TSH .473  SH: Stopped drinking in 11/20. Worked at [employer] in the [employer]. Lives with his wife. Has quit  smoking.      PMH 1. Fe deficiency anemia.  2. Tobacco Abuse 3. Chronic systolic CHF: Nonischemic cardiomyopathy.  - 03/10/19 ECHO EF 25-30%  - RHC/LHC (11/20): mean RA 3, PA 41/21, mean PCWP 17, CI 1.9, PVR 3.75 WU; no significant coronary disease.  - Echo (2/21): EF 30-35%, diffuse hypokinesis, mildly decreased RV systolic function.  - Medtronic ICD - Cardiac MRI (9/21) was uninterpretable due to ICD artifact.  - Echo (5/22): EF 45% with normal RV. - Echo (5/23): EF 55% with normal RV, IVC normal. 4. PVCs: frequent.   - Zio patch monitor in 3/21 on amiodarone showed no significant arrhythmias.  - Zio patch monitor 12/21 with 9.7% PVCs (off amiodarone).  5. HTN  ROS: All systems negative except as listed in HPI, PMH and Problem List.  FH:  Family History  Problem Relation Age of Onset   Hypertension Mother    Kidney disease Mother     Current Outpatient Medications  Medication Sig Dispense Refill   acetaminophen (TYLENOL) 500 MG tablet Take 1,000 mg by mouth every 6 (six) hours as needed for mild pain.     amiodarone (PACERONE) 200 MG tablet Take 0.5 tablets (100 mg total) by mouth daily. 15 tablet 11   carvedilol (COREG) 6.25 MG tablet Take 1 tablet (6.25 mg total) by mouth 2 (two) times daily with a meal. 60 tablet 11   empagliflozin (JARDIANCE) 10 MG TABS tablet Take 1 tablet (10 mg total) by mouth daily before breakfast. 30 tablet 11   furosemide (LASIX) 40 MG tablet Take 1 tablet (  40 mg total) by mouth as needed. 30 tablet 3   hydrALAZINE (APRESOLINE) 25 MG tablet Take 1 tablet (25 mg total) by mouth 3 (three) times daily. 90 tablet 11   isosorbide mononitrate (IMDUR) 30 MG 24 hr tablet Take 1 tablet (30 mg total) by mouth daily. 30 tablet 11   sacubitril-valsartan (ENTRESTO) 49-51 MG Take 1 tablet by mouth 2 (two) times daily. 180 tablet 3   spironolactone (ALDACTONE) 25 MG tablet Take 1 tablet (25 mg total) by mouth daily. 30 tablet 11   No current facility-administered  medications for this encounter.    Vitals:   09/01/22 1210  BP: 104/62  Pulse: 60  SpO2: 98%  Weight: 65.7 kg (144 lb 12.8 oz)    Wt Readings from Last 3 Encounters:  09/01/22 65.7 kg (144 lb 12.8 oz)  01/30/22 65.4 kg (144 lb 1.9 oz)  09/06/21 66.2 kg (146 lb)   PHYSICAL EXAM: General:  well appearing.  No respiratory difficulty HEENT: normal Neck: supple. JVD flat. Carotids 2+ bilat; no bruits. No lymphadenopathy or thyromegaly appreciated. Cor: PMI nondisplaced. Regular rate & rhythm. No rubs, gallops or murmurs. Lungs: clear Abdomen: soft, nontender, nondistended. No hepatosplenomegaly. No bruits or masses. Good bowel sounds. Extremities: no cyanosis, clubbing, rash, edema  Neuro: alert & oriented x 3, cranial nerves grossly intact. moves all 4 extremities w/o difficulty. Affect pleasant.   ASSESSMENT & PLAN: 1. Chronic Systolic CHF: Nonischemic cardiomyopathy, echo 03/10/19 with EF 25-30%, RV ok.  No family history of cardiomyopathy.  No significant coronary disease on cath in 11/20.  No definite pre-existing viral symptoms.  It is possible that this could be a PVC-mediated CMP given frequent PVCs at the time of diagnosis (or that the cardiomyopathy itself could be driving the PVCs).  RHC in 11/20 showed low cardiac index at 1.8. CT chest in 11/20 with no evidence for sarcoid. Echo in 2/21 showed that EF remains 25-30%. MDT ICD placed in 5/21. Echo in 5/22 with EF up to 45%. Echo today showed EF 55% with normal RV, IVC normal.  Echo 5/23 EF 55%, GIDD, RV normal, trivial MR, mild TR.  - NYHA class I-II symptoms, not volume overloaded by exam.  - Continue Farxiga 10 mg daily.   - Continue Coreg 6.25 mg bid.  - Continue hydralazine 25 mg three times a day + 30 mg Imdur daily.  - Continue spiro 25 mg daily.  BMET today.  - Continue Entresto 49/51 bid.   - update echo 2. PVCs/NSVT: Very frequent when initially diagnosed in 11/20.  ?Cause of CM versus result of CM.  Decreased PVCs on  amiodarone => Zio patch on amiodarone 100 mg daily showed no arrhythmias.  He now has a MDT ICD.  Amiodarone was stopped and 12/21 Zio patch was done, showing PVCs 9.7%.  Amiodarone was restarted.   - Follows with Dr. Elberta Fortis - EKG today a paced 60 bpm, no PVCs (Personally reviewed)   - Due for f/u with Dr. Elberta Fortis 11/24 - Continue amiodarone 100 mg daily, check LFTs, TSH, T3, T4.   - Complaining of intermittent eye blurriness. Asked to establish care with eye doctor asap. Also, can consider switching to mexiletine to avoid long term amiodarone complications.   Schedule echo. F/u with Dr. Shirlee Latch in 6 months.   Alen Bleacher AGACNP-BC  09/01/2022 Advanced [facility] Team

## 2022-09-02 LAB — T3: T3, Total: 133 ng/dL (ref 71–180)

## 2022-09-02 LAB — T4: T4, Total: 14.6 ug/dL — ABNORMAL HIGH (ref 4.5–12.0)

## 2022-09-21 ENCOUNTER — Other Ambulatory Visit (HOSPITAL_COMMUNITY): Payer: Self-pay | Admitting: Cardiology

## 2022-09-28 ENCOUNTER — Ambulatory Visit (INDEPENDENT_AMBULATORY_CARE_PROVIDER_SITE_OTHER): Payer: Medicare Other

## 2022-09-28 DIAGNOSIS — I428 Other cardiomyopathies: Secondary | ICD-10-CM

## 2022-09-28 LAB — CUP PACEART REMOTE DEVICE CHECK
Battery Remaining Longevity: 95 mo
Battery Voltage: 3 V
Brady Statistic AP VP Percent: 0.02 %
Brady Statistic AP VS Percent: 34.61 %
Brady Statistic AS VP Percent: 0.02 %
Brady Statistic AS VS Percent: 65.36 %
Brady Statistic RA Percent Paced: 34.61 %
Brady Statistic RV Percent Paced: 0.04 %
Date Time Interrogation Session: 20240530102031
HighPow Impedance: 63 Ohm
Implantable Lead Connection Status: 753985
Implantable Lead Connection Status: 753985
Implantable Lead Implant Date: 20210526
Implantable Lead Implant Date: 20210526
Implantable Lead Location: 753859
Implantable Lead Location: 753860
Implantable Lead Model: 5076
Implantable Pulse Generator Implant Date: 20210526
Lead Channel Impedance Value: 380 Ohm
Lead Channel Impedance Value: 380 Ohm
Lead Channel Impedance Value: 456 Ohm
Lead Channel Pacing Threshold Amplitude: 0.5 V
Lead Channel Pacing Threshold Amplitude: 0.625 V
Lead Channel Pacing Threshold Pulse Width: 0.4 ms
Lead Channel Pacing Threshold Pulse Width: 0.4 ms
Lead Channel Sensing Intrinsic Amplitude: 3.75 mV
Lead Channel Sensing Intrinsic Amplitude: 3.75 mV
Lead Channel Sensing Intrinsic Amplitude: 6.875 mV
Lead Channel Sensing Intrinsic Amplitude: 6.875 mV
Lead Channel Setting Pacing Amplitude: 1.5 V
Lead Channel Setting Pacing Amplitude: 2.5 V
Lead Channel Setting Pacing Pulse Width: 0.4 ms
Lead Channel Setting Sensing Sensitivity: 0.3 mV
Zone Setting Status: 755011
Zone Setting Status: 755011

## 2022-10-04 ENCOUNTER — Other Ambulatory Visit (HOSPITAL_COMMUNITY): Payer: Self-pay | Admitting: Cardiology

## 2022-10-16 ENCOUNTER — Other Ambulatory Visit (HOSPITAL_COMMUNITY): Payer: Self-pay | Admitting: Cardiology

## 2022-10-17 NOTE — Progress Notes (Signed)
Remote ICD transmission.   

## 2022-11-15 ENCOUNTER — Ambulatory Visit (HOSPITAL_COMMUNITY)
Admission: RE | Admit: 2022-11-15 | Discharge: 2022-11-15 | Disposition: A | Discharge status: Home / Self Care | Payer: Medicare Other | Source: Ambulatory Visit | Attending: Cardiology | Admitting: Cardiology

## 2022-11-15 ENCOUNTER — Telehealth (HOSPITAL_COMMUNITY): Payer: Self-pay

## 2022-11-15 DIAGNOSIS — I5022 Chronic systolic (congestive) heart failure: Secondary | ICD-10-CM | POA: Diagnosis not present

## 2022-11-15 DIAGNOSIS — I351 Nonrheumatic aortic (valve) insufficiency: Secondary | ICD-10-CM | POA: Diagnosis not present

## 2022-11-15 LAB — ECHOCARDIOGRAM COMPLETE
Area-P 1/2: 3.37 cm2
Calc EF: 48.2 %
P 1/2 time: 632 msec
S' Lateral: 3 cm
Single Plane A2C EF: 47.7 %
Single Plane A4C EF: 48.3 %

## 2022-11-15 NOTE — Telephone Encounter (Signed)
Attempted to call patient, Spoke with patients wife Claudia(okay per DPR) advised her of results and she verbalized understanding. Advised her to call back with any issues, questions, or concerns and she verbalized understanding.

## 2022-11-15 NOTE — Telephone Encounter (Signed)
-----   Message from Marca Ancona sent at 11/15/2022  4:22 PM EDT ----- EF low normal to mildly decreased.  This is ok.

## 2022-11-15 NOTE — Progress Notes (Signed)
  Echocardiogram 2D Echocardiogram has been performed.  Delcie Roch 11/15/2022, 2:42 PM

## 2022-12-28 ENCOUNTER — Ambulatory Visit (INDEPENDENT_AMBULATORY_CARE_PROVIDER_SITE_OTHER): Payer: Medicare Other

## 2022-12-28 DIAGNOSIS — I428 Other cardiomyopathies: Secondary | ICD-10-CM

## 2023-01-01 ENCOUNTER — Other Ambulatory Visit (HOSPITAL_COMMUNITY): Payer: Self-pay | Admitting: Cardiology

## 2023-01-02 LAB — CUP PACEART REMOTE DEVICE CHECK
Battery Remaining Longevity: 91 mo
Battery Voltage: 3 V
Brady Statistic AP VP Percent: 0.02 %
Brady Statistic AP VS Percent: 43.32 %
Brady Statistic AS VP Percent: 0.02 %
Brady Statistic AS VS Percent: 56.64 %
Brady Statistic RA Percent Paced: 43.33 %
Brady Statistic RV Percent Paced: 0.04 %
Date Time Interrogation Session: 20240831065523
HighPow Impedance: 68 Ohm
Implantable Lead Connection Status: 753985
Implantable Lead Connection Status: 753985
Implantable Lead Implant Date: 20210526
Implantable Lead Implant Date: 20210526
Implantable Lead Location: 753859
Implantable Lead Location: 753860
Implantable Lead Model: 5076
Implantable Pulse Generator Implant Date: 20210526
Lead Channel Impedance Value: 380 Ohm
Lead Channel Impedance Value: 437 Ohm
Lead Channel Impedance Value: 494 Ohm
Lead Channel Pacing Threshold Amplitude: 0.5 V
Lead Channel Pacing Threshold Amplitude: 0.625 V
Lead Channel Pacing Threshold Pulse Width: 0.4 ms
Lead Channel Pacing Threshold Pulse Width: 0.4 ms
Lead Channel Sensing Intrinsic Amplitude: 3.625 mV
Lead Channel Sensing Intrinsic Amplitude: 3.625 mV
Lead Channel Sensing Intrinsic Amplitude: 6.25 mV
Lead Channel Sensing Intrinsic Amplitude: 6.25 mV
Lead Channel Setting Pacing Amplitude: 1.5 V
Lead Channel Setting Pacing Amplitude: 2.5 V
Lead Channel Setting Pacing Pulse Width: 0.4 ms
Lead Channel Setting Sensing Sensitivity: 0.3 mV
Zone Setting Status: 755011
Zone Setting Status: 755011

## 2023-01-04 NOTE — Progress Notes (Signed)
Remote ICD transmission.   

## 2023-03-30 ENCOUNTER — Ambulatory Visit (INDEPENDENT_AMBULATORY_CARE_PROVIDER_SITE_OTHER): Payer: Medicare Other

## 2023-03-30 DIAGNOSIS — I428 Other cardiomyopathies: Secondary | ICD-10-CM

## 2023-03-31 LAB — CUP PACEART REMOTE DEVICE CHECK
Battery Remaining Longevity: 88 mo
Battery Voltage: 3 V
Brady Statistic AP VP Percent: 0.02 %
Brady Statistic AP VS Percent: 38.97 %
Brady Statistic AS VP Percent: 0.02 %
Brady Statistic AS VS Percent: 60.99 %
Brady Statistic RA Percent Paced: 38.98 %
Brady Statistic RV Percent Paced: 0.04 %
Date Time Interrogation Session: 20241130073114
HighPow Impedance: 67 Ohm
Implantable Lead Connection Status: 753985
Implantable Lead Connection Status: 753985
Implantable Lead Implant Date: 20210526
Implantable Lead Implant Date: 20210526
Implantable Lead Location: 753859
Implantable Lead Location: 753860
Implantable Lead Model: 5076
Implantable Pulse Generator Implant Date: 20210526
Lead Channel Impedance Value: 380 Ohm
Lead Channel Impedance Value: 399 Ohm
Lead Channel Impedance Value: 494 Ohm
Lead Channel Pacing Threshold Amplitude: 0.5 V
Lead Channel Pacing Threshold Amplitude: 0.625 V
Lead Channel Pacing Threshold Pulse Width: 0.4 ms
Lead Channel Pacing Threshold Pulse Width: 0.4 ms
Lead Channel Sensing Intrinsic Amplitude: 3.875 mV
Lead Channel Sensing Intrinsic Amplitude: 3.875 mV
Lead Channel Sensing Intrinsic Amplitude: 5.625 mV
Lead Channel Sensing Intrinsic Amplitude: 5.625 mV
Lead Channel Setting Pacing Amplitude: 1.5 V
Lead Channel Setting Pacing Amplitude: 2.5 V
Lead Channel Setting Pacing Pulse Width: 0.4 ms
Lead Channel Setting Sensing Sensitivity: 0.3 mV
Zone Setting Status: 755011
Zone Setting Status: 755011

## 2023-04-23 NOTE — Addendum Note (Signed)
Addended by: Elease Etienne A on: 04/23/2023 09:20 AM   Modules accepted: Orders

## 2023-04-23 NOTE — Progress Notes (Signed)
Remote ICD transmission.   

## 2023-05-23 ENCOUNTER — Other Ambulatory Visit (HOSPITAL_COMMUNITY): Payer: Self-pay | Admitting: Cardiology

## 2023-06-28 ENCOUNTER — Ambulatory Visit (INDEPENDENT_AMBULATORY_CARE_PROVIDER_SITE_OTHER): Payer: Medicare Other

## 2023-06-28 DIAGNOSIS — I428 Other cardiomyopathies: Secondary | ICD-10-CM

## 2023-06-29 ENCOUNTER — Telehealth (HOSPITAL_COMMUNITY): Payer: Self-pay

## 2023-06-29 NOTE — Telephone Encounter (Signed)
 Called and spoke to pt's wide Claudia to confirm/remind patient of their appointment at the [facility] on 07/02/23.   Patient reminded to bring all medications and/or complete list.  Confirmed patient has transportation. Gave directions, instructed to utilize valet parking.  Confirmed appointment prior to ending call.

## 2023-07-01 NOTE — Progress Notes (Signed)
 ADVANCED HF CLINIC NOTE   PCP: [facility]. Primary Cardiologist: Dr Shirlee Latch   Chief Complaint: Heart Failure follow-up  HPI: Logan Lopez is a 68 y.o. with a history of iron deficient anemia, former smoker, chronic systolic heart failure s/p Medtronic ICD, and PVCs.   Echo 11/20 showed reduced EF 25-30%. L/RHC showed low cardiac ouput/index and no significant coronary disease. While hospitalized he had high PVC burden so amiodarone was started. Discharged with [facility].   Echo in 2/21 showed EF 30-35%, diffuse hypokinesis, mildly decreased RV systolic function.  Zio patch monitor in 3/21 showed no significant arrhythmias.  He had Medtronic ICD placed in 5/21.   Zio patch in 12/21 off amiodarone showed 9.7% PVCs, amiodarone was restarted.   Echo 5/23 EF 55%, GIDD, RV normal, trivial Logan, mild TR Echo 7/24: EF 50-55, mildly reduced RV, no Logan, triv AI  Admitted 06/01/23 to [facility] for sepsis after having gastric perforation after swallowing a chicken bone s/p EGD with removal and clip. 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 During admission noted to be positive for COVID-19 and Flu A. At discharge imdur, hydral, and entresto held.   Today he returns for HF follow up with wife. Overall feeling fine. Denies increasing SOB, palpitations, abnormal bleeding, CP, dizziness, edema, or PND/Orthopnea. Appetite ok. No fever or chills. Has gotten over the flu. No abdominal pain. Now off Eliquis for septic thrombi. Taking all medications.   ECG (personally reviewed): Apaced at 60 bpm, no PVCs  MDT device interrogation (personally reviewed): No AT/AF, 2.8 hrs/day activity, optivol and thoracic significantly above range, but trending down (suspect delayed from volume received during recent admission for sepsis)  ReDs reading: 30 %, normal  SH: Stopped drinking in 11/20. Worked at [employer] in the [employer]. Lives  with his wife. Has quit smoking.      PMH 1. Fe deficiency anemia.  2. Tobacco Abuse 3. Chronic systolic CHF: Nonischemic cardiomyopathy.  - 03/10/19 ECHO EF 25-30%  - RHC/LHC (11/20): mean RA 3, PA 41/21, mean PCWP 17, CI 1.9, PVR 3.75 WU; no significant coronary disease.  - Echo (2/21): EF 30-35%, diffuse hypokinesis, mildly decreased RV systolic function.  - Medtronic ICD - Cardiac MRI (9/21) was uninterpretable due to ICD artifact.  - Echo (5/22): EF 45% with normal RV. - Echo (5/23): EF 55% with normal RV, IVC normal. 4. PVCs: frequent.   - Zio patch monitor in 3/21 on amiodarone showed no significant arrhythmias.  - Zio patch monitor 12/21 with 9.7% PVCs (off amiodarone).  5. HTN  ROS: All systems negative except as listed in HPI, PMH and Problem List.  FH:  Family History  Problem Relation Age of Onset   Hypertension Mother    Kidney disease Mother     Current Outpatient Medications  Medication Sig Dispense Refill   acetaminophen (TYLENOL) 500 MG tablet Take 1,000 mg by mouth every 6 (six) hours as needed for mild pain.     apixaban (ELIQUIS) 5 MG TABS tablet Take 5 mg by mouth 2 (two) times daily.     carvedilol (COREG) 6.25 MG tablet TAKE 1 TABLET BY MOUTH TWICE DAILY WITH A MEAL 180 tablet 3   furosemide (LASIX) 40 MG tablet Take 1 tablet (40 mg total) by mouth as needed. 30 tablet 3   JARDIANCE 10 MG TABS tablet TAKE 1 TABLET BY MOUTH ONCE DAILY BEFORE BREAKFAST 30 tablet 0   linezolid (ZYVOX) 600  MG tablet Take 600 mg by mouth 2 (two) times daily.     mexiletine (MEXITIL) 200 MG capsule Take 1 capsule (200 mg total) by mouth 2 (two) times daily. 60 capsule 11   sacubitril-valsartan (ENTRESTO) 24-26 MG Take 1 tablet by mouth 2 (two) times daily. 60 tablet 11   hydrALAZINE (APRESOLINE) 25 MG tablet TAKE 1 TABLET BY MOUTH THREE TIMES DAILY (Patient not taking: Reported on 07/02/2023) 220 tablet 4   isosorbide mononitrate (IMDUR) 30 MG 24 hr tablet Take 1 tablet by mouth  once daily (Patient not taking: Reported on 07/02/2023) 30 tablet 6   spironolactone (ALDACTONE) 25 MG tablet Take 1 tablet by mouth once daily (Patient not taking: Reported on 07/02/2023) 90 tablet 3   No current facility-administered medications for this encounter.    Vitals:   07/02/23 0926  BP: 128/72  Pulse: 64  SpO2: 99%  Weight: 64.8 kg (142 lb 12.8 oz)  Height: 5\' 11"  (1.803 m)    Wt Readings from Last 3 Encounters:  07/02/23 64.8 kg (142 lb 12.8 oz)  09/01/22 65.7 kg (144 lb 12.8 oz)  01/30/22 65.4 kg (144 lb 1.9 oz)   PHYSICAL EXAM: General: Well appearing. No distress on RA Cardiac: JVP not elevated. S1 and S2 present. No murmurs or rub. Resp: Lung sounds clear and equal B/L Extremities: Warm and dry. No rash, cyanosis.  No  edema.  Neuro: Alert and oriented x3. Affect pleasant. Moves all extremities without difficulty.  ASSESSMENT & PLAN: 1. HFimpEF: NICM. Echo 11/20  EF 25-30%, RV ok.  No family history of cardiomyopathy.  No significant CAD on cath in 11/20.  No definite pre-existing viral symptoms.  It is possible that this could be a PVC-mediated CMP given frequent PVCs at the time of diagnosis (or that the cardiomyopathy itself could be driving the PVCs).  RHC in 11/20 showed low cardiac index at 1.8. CT chest in 11/20 with no evidence for sarcoid. Echo in 2/21 showed that EF remains 25-30%. MDT ICD placed in 5/21. Echo in 5/22 with EF up to 45%. Echo 5/23 EF 55%, GIDD, RV normal, trivial Logan, mild TR. Repeat 7/24 relatively unchanged. - NYHA class I-II symptoms, not volume overloaded by exam.  - Optivol significantly elevated but coming down, euvolemic on exam, Reds 30%, suspect residually elevated from recent admission. Will have a repeat Optivol read sent in 10-14 days.  - Continue Farxiga 10 mg daily.   - Continue Coreg 6.25 mg bid.  - Continue spiro 25 mg daily.  BNP/BMET today.  - Resume Entresto 24/26 mg bid.   - BP too low to resume hydral and imdur  2.  PVCs/NSVT: Very frequent when initially diagnosed in 11/20. ?Cause of CM versus result of CM.   He has MDT ICD. Amiodarone was stopped and 12/21 Zio patch was done, showing PVCs 9.7%. Amiodarone was restarted.   - EKG today a paced at 60 bpm, no PVCs - Stop amio due to long term side effects of amiodarone. Check LFTs, TSH, T3, T4, PFTs, gets regular eye exams. - Start Mexiletine 200 mg bid - Follows with Dr. Elberta Fortis. Needs to schedule appointment.  F/u with Dr. Shirlee Latch in 6 months. Consider graduation.   Logan Dimitry Holsworth, NP 07/02/2023 Advanced Heart Failure Team

## 2023-07-02 ENCOUNTER — Other Ambulatory Visit (HOSPITAL_COMMUNITY): Payer: Self-pay

## 2023-07-02 ENCOUNTER — Ambulatory Visit (HOSPITAL_COMMUNITY)
Admission: RE | Admit: 2023-07-02 | Discharge: 2023-07-02 | Disposition: A | Discharge status: Home / Self Care | Payer: Medicare Other | Source: Ambulatory Visit | Attending: Cardiology | Admitting: Cardiology

## 2023-07-02 ENCOUNTER — Encounter (HOSPITAL_COMMUNITY): Payer: Self-pay

## 2023-07-02 VITALS — BP 128/72 | HR 64 | Ht 71.0 in | Wt 142.8 lb

## 2023-07-02 DIAGNOSIS — Z87891 Personal history of nicotine dependence: Secondary | ICD-10-CM | POA: Diagnosis not present

## 2023-07-02 DIAGNOSIS — Z8616 Personal history of COVID-19: Secondary | ICD-10-CM | POA: Insufficient documentation

## 2023-07-02 DIAGNOSIS — I5032 Chronic diastolic (congestive) heart failure: Secondary | ICD-10-CM

## 2023-07-02 DIAGNOSIS — I5022 Chronic systolic (congestive) heart failure: Secondary | ICD-10-CM | POA: Insufficient documentation

## 2023-07-02 DIAGNOSIS — I493 Ventricular premature depolarization: Secondary | ICD-10-CM | POA: Diagnosis not present

## 2023-07-02 DIAGNOSIS — I472 Ventricular tachycardia, unspecified: Secondary | ICD-10-CM | POA: Diagnosis not present

## 2023-07-02 DIAGNOSIS — Z79899 Other long term (current) drug therapy: Secondary | ICD-10-CM | POA: Insufficient documentation

## 2023-07-02 DIAGNOSIS — I110 Hypertensive heart disease with heart failure: Secondary | ICD-10-CM | POA: Diagnosis not present

## 2023-07-02 DIAGNOSIS — Z95810 Presence of automatic (implantable) cardiac defibrillator: Secondary | ICD-10-CM | POA: Insufficient documentation

## 2023-07-02 DIAGNOSIS — I428 Other cardiomyopathies: Secondary | ICD-10-CM | POA: Insufficient documentation

## 2023-07-02 LAB — HEPATIC FUNCTION PANEL
ALT: 22 U/L (ref 0–44)
AST: 29 U/L (ref 15–41)
Albumin: 3.7 g/dL (ref 3.5–5.0)
Alkaline Phosphatase: 79 U/L (ref 38–126)
Bilirubin, Direct: 0.1 mg/dL (ref 0.0–0.2)
Indirect Bilirubin: 0.5 mg/dL (ref 0.3–0.9)
Total Bilirubin: 0.6 mg/dL (ref 0.0–1.2)
Total Protein: 8.2 g/dL — ABNORMAL HIGH (ref 6.5–8.1)

## 2023-07-02 LAB — BASIC METABOLIC PANEL
Anion gap: 13 (ref 5–15)
BUN: 7 mg/dL — ABNORMAL LOW (ref 8–23)
CO2: 25 mmol/L (ref 22–32)
Calcium: 10 mg/dL (ref 8.9–10.3)
Chloride: 100 mmol/L (ref 98–111)
Creatinine, Ser: 0.94 mg/dL (ref 0.61–1.24)
GFR, Estimated: >60 mL/min (ref >60)
Glucose, Bld: 76 mg/dL (ref 70–99)
Potassium: 4.6 mmol/L (ref 3.5–5.1)
Sodium: 138 mmol/L (ref 135–145)

## 2023-07-02 LAB — BRAIN NATRIURETIC PEPTIDE: B Natriuretic Peptide: 25.8 pg/mL (ref 0.0–100.0)

## 2023-07-02 LAB — T4, FREE: Free T4: 0.91 ng/dL (ref 0.61–1.12)

## 2023-07-02 LAB — TSH: TSH: 0.695 u[IU]/mL (ref 0.350–4.500)

## 2023-07-02 MED ORDER — SACUBITRIL-VALSARTAN 24-26 MG PO TABS: 1.0000 | ORAL_TABLET | Freq: Two times a day (BID) | ORAL | 11 refills | Status: AC

## 2023-07-02 MED ORDER — MEXILETINE HCL 200 MG PO CAPS: 200.0000 mg | ORAL_CAPSULE | Freq: Two times a day (BID) | ORAL | 11 refills | Status: AC

## 2023-07-02 NOTE — Patient Instructions (Addendum)
 Start Entresto 24/26 mg twice daily. Stop amiodarone. Start Mexilitine 200 mg twice daily. $30.00 with Good Rx card at your local pharmacy.  Labs today - will call you if abnormal. Please take your blood pressure at home and keep a log. Pulmonary function tests ordered - we will call you to schedule after prior authorization obtained. Please make follow up appointment with your EP doctor - see their number below. Please return to see Dr. Shirlee Latch in 6 months. Please call us at [phone] in August to schedule this appointment.  Please call us at [phone] if any questions or concerns prior to your next appointment.    [facility] Address: [address], [place], [place] [address] Phone: [phone]

## 2023-07-02 NOTE — Progress Notes (Signed)
 ReDS Vest / Clip - 07/02/23 1000       ReDS Vest / Clip   Station Marker C    Ruler Value 28    ReDS Value Range Low volume    ReDS Actual Value 30           '

## 2023-07-03 ENCOUNTER — Other Ambulatory Visit (HOSPITAL_COMMUNITY): Payer: Self-pay

## 2023-07-03 ENCOUNTER — Telehealth (HOSPITAL_COMMUNITY): Payer: Self-pay | Admitting: Pharmacist

## 2023-07-03 LAB — T3, FREE: T3, Free: 2.5 pg/mL (ref 2.0–4.4)

## 2023-07-03 NOTE — Telephone Encounter (Signed)
 Patient Advocate Encounter   Received notification from [facility] that prior authorization for Mexiletine is required.   PA submitted on CoverMyMeds Key [id number] Status is pending   Will continue to follow.   Karle Plumber, PharmD, BCPS, BCCP, CPP Heart Failure Clinic Pharmacist [phone]'

## 2023-07-03 NOTE — Telephone Encounter (Signed)
 Advanced Heart Failure Patient Advocate Encounter  Prior Authorization for mexiletine has been approved.    Effective dates: 07/02/23 - "until further notice"  Karle Plumber, PharmD, BCPS, BCCP, CPP Heart Failure Clinic Pharmacist [phone]

## 2023-07-06 ENCOUNTER — Other Ambulatory Visit (HOSPITAL_COMMUNITY): Payer: Self-pay | Admitting: Cardiology

## 2023-07-06 ENCOUNTER — Telehealth (HOSPITAL_COMMUNITY): Payer: Self-pay

## 2023-07-06 ENCOUNTER — Other Ambulatory Visit (HOSPITAL_COMMUNITY): Payer: Self-pay

## 2023-07-06 NOTE — Telephone Encounter (Signed)
 Advanced Heart Failure Patient Advocate Encounter  The patient was approved for a [employer] grant that will help cover the cost of Carvedilol, Entresto, Jardiance, Spironolactone.  Total amount awarded, $10,000.  Effective: 06/06/2023 - 06/04/2024.  BIN [id number] PCN [id number] Group [id number]  ID [id number]  Pharmacy provided with approval and processing information. Patient informed via phone.  Burnell Blanks, CPhT Rx Patient Advocate Phone: [phone]

## 2023-07-09 ENCOUNTER — Encounter

## 2023-07-10 ENCOUNTER — Encounter: Payer: Self-pay | Admitting: Cardiology

## 2023-07-30 ENCOUNTER — Ambulatory Visit (HOSPITAL_COMMUNITY)
Admission: RE | Admit: 2023-07-30 | Discharge: 2023-07-30 | Disposition: A | Discharge status: Home / Self Care | Source: Ambulatory Visit | Attending: Cardiology | Admitting: Cardiology

## 2023-07-30 DIAGNOSIS — I472 Ventricular tachycardia, unspecified: Secondary | ICD-10-CM | POA: Insufficient documentation

## 2023-07-30 DIAGNOSIS — I5032 Chronic diastolic (congestive) heart failure: Secondary | ICD-10-CM | POA: Diagnosis present

## 2023-07-30 LAB — PULMONARY FUNCTION TEST
DL/VA % pred: 62 %
DL/VA: 2.59 ml/min/mmHg/L
DLCO unc % pred: 50 %
DLCO unc: 14.05 ml/min/mmHg
FEF 25-75 Post: 2.61 L/s
FEF 25-75 Pre: 2.63 L/s
FEF2575-%Change-Post: -1 %
FEF2575-%Pred-Post: 92 %
FEF2575-%Pred-Pre: 93 %
FEV1-%Change-Post: 0 %
FEV1-%Pred-Post: 88 %
FEV1-%Pred-Pre: 89 %
FEV1-Post: 3.17 L
FEV1-Pre: 3.2 L
FEV1FVC-%Change-Post: 2 %
FEV1FVC-%Pred-Pre: 103 %
FEV6-%Change-Post: -2 %
FEV6-%Pred-Post: 88 %
FEV6-%Pred-Pre: 90 %
FEV6-Post: 4 L
FEV6-Pre: 4.11 L
FEV6FVC-%Change-Post: 0 %
FEV6FVC-%Pred-Post: 105 %
FEV6FVC-%Pred-Pre: 104 %
FVC-%Change-Post: -3 %
FVC-%Pred-Post: 83 %
FVC-%Pred-Pre: 86 %
FVC-Post: 4.01 L
FVC-Pre: 4.14 L
Post FEV1/FVC ratio: 79 %
Post FEV6/FVC ratio: 100 %
Pre FEV1/FVC ratio: 77 %
Pre FEV6/FVC Ratio: 99 %
RV % pred: 72 %
RV: 1.74 L
TLC % pred: 78 %
TLC: 5.68 L

## 2023-07-30 MED ORDER — ALBUTEROL SULFATE (2.5 MG/3ML) 0.083% IN NEBU
2.5000 mg | INHALATION_SOLUTION | Freq: Once | RESPIRATORY_TRACT | Status: AC
  Administered 2023-07-30: 2.5 mg via RESPIRATORY_TRACT

## 2023-07-31 NOTE — Progress Notes (Signed)
 Remote ICD transmission.

## 2023-08-05 ENCOUNTER — Other Ambulatory Visit (HOSPITAL_COMMUNITY): Payer: Self-pay | Admitting: Cardiology

## 2023-08-13 ENCOUNTER — Ambulatory Visit (INDEPENDENT_AMBULATORY_CARE_PROVIDER_SITE_OTHER)

## 2023-08-13 DIAGNOSIS — I472 Ventricular tachycardia, unspecified: Secondary | ICD-10-CM

## 2023-08-13 DIAGNOSIS — I428 Other cardiomyopathies: Secondary | ICD-10-CM

## 2023-08-15 LAB — CUP PACEART REMOTE DEVICE CHECK
Battery Remaining Longevity: 80 mo
Battery Voltage: 3 V
Brady Statistic AP VP Percent: 0.01 %
Brady Statistic AP VS Percent: 32.87 %
Brady Statistic AS VP Percent: 0.02 %
Brady Statistic AS VS Percent: 67.09 %
Brady Statistic RA Percent Paced: 32.88 %
Brady Statistic RV Percent Paced: 0.04 %
Date Time Interrogation Session: 20250414180615
HighPow Impedance: 81 Ohm
Implantable Lead Connection Status: 753985
Implantable Lead Connection Status: 753985
Implantable Lead Implant Date: 20210526
Implantable Lead Implant Date: 20210526
Implantable Lead Location: 753859
Implantable Lead Location: 753860
Implantable Lead Model: 5076
Implantable Pulse Generator Implant Date: 20210526
Lead Channel Impedance Value: 399 Ohm
Lead Channel Impedance Value: 456 Ohm
Lead Channel Impedance Value: 532 Ohm
Lead Channel Pacing Threshold Amplitude: 0.5 V
Lead Channel Pacing Threshold Amplitude: 0.625 V
Lead Channel Pacing Threshold Pulse Width: 0.4 ms
Lead Channel Pacing Threshold Pulse Width: 0.4 ms
Lead Channel Sensing Intrinsic Amplitude: 4.5 mV
Lead Channel Sensing Intrinsic Amplitude: 4.5 mV
Lead Channel Sensing Intrinsic Amplitude: 5.5 mV
Lead Channel Sensing Intrinsic Amplitude: 5.5 mV
Lead Channel Setting Pacing Amplitude: 1.5 V
Lead Channel Setting Pacing Amplitude: 2.5 V
Lead Channel Setting Pacing Pulse Width: 0.4 ms
Lead Channel Setting Sensing Sensitivity: 0.3 mV
Zone Setting Status: 755011
Zone Setting Status: 755011

## 2023-09-12 ENCOUNTER — Other Ambulatory Visit (HOSPITAL_COMMUNITY): Payer: Self-pay | Admitting: Cardiology

## 2023-10-01 ENCOUNTER — Encounter

## 2023-10-01 NOTE — Progress Notes (Signed)
 Remote ICD transmission.

## 2023-10-01 NOTE — Addendum Note (Signed)
 Addended by: Edra Govern D on: 10/01/2023 05:43 PM   Modules accepted: Orders

## 2023-11-12 ENCOUNTER — Ambulatory Visit

## 2023-11-12 DIAGNOSIS — I428 Other cardiomyopathies: Secondary | ICD-10-CM | POA: Diagnosis not present

## 2023-11-14 ENCOUNTER — Ambulatory Visit: Payer: Self-pay | Admitting: Cardiology

## 2023-11-14 LAB — CUP PACEART REMOTE DEVICE CHECK
Battery Remaining Longevity: 80 mo
Battery Voltage: 2.99 V
Brady Statistic AP VP Percent: 0.01 %
Brady Statistic AP VS Percent: 13.23 %
Brady Statistic AS VP Percent: 0.03 %
Brady Statistic AS VS Percent: 86.74 %
Brady Statistic RA Percent Paced: 13.22 %
Brady Statistic RV Percent Paced: 0.04 %
Date Time Interrogation Session: 20250715191019
HighPow Impedance: 81 Ohm
Implantable Lead Connection Status: 753985
Implantable Lead Connection Status: 753985
Implantable Lead Implant Date: 20210526
Implantable Lead Implant Date: 20210526
Implantable Lead Location: 753859
Implantable Lead Location: 753860
Implantable Lead Model: 5076
Implantable Pulse Generator Implant Date: 20210526
Lead Channel Impedance Value: 437 Ohm
Lead Channel Impedance Value: 456 Ohm
Lead Channel Impedance Value: 532 Ohm
Lead Channel Pacing Threshold Amplitude: 0.5 V
Lead Channel Pacing Threshold Amplitude: 0.625 V
Lead Channel Pacing Threshold Pulse Width: 0.4 ms
Lead Channel Pacing Threshold Pulse Width: 0.4 ms
Lead Channel Sensing Intrinsic Amplitude: 4.625 mV
Lead Channel Sensing Intrinsic Amplitude: 4.625 mV
Lead Channel Sensing Intrinsic Amplitude: 5 mV
Lead Channel Sensing Intrinsic Amplitude: 5 mV
Lead Channel Setting Pacing Amplitude: 1.5 V
Lead Channel Setting Pacing Amplitude: 2.5 V
Lead Channel Setting Pacing Pulse Width: 0.4 ms
Lead Channel Setting Sensing Sensitivity: 0.3 mV
Zone Setting Status: 755011
Zone Setting Status: 755011

## 2023-12-28 ENCOUNTER — Other Ambulatory Visit (HOSPITAL_COMMUNITY): Payer: Self-pay | Admitting: Cardiology

## 2024-01-01 ENCOUNTER — Encounter

## 2024-01-29 ENCOUNTER — Other Ambulatory Visit (HOSPITAL_COMMUNITY): Payer: Self-pay | Admitting: Cardiology

## 2024-02-07 NOTE — Progress Notes (Signed)
 Remote ICD Transmission

## 2024-02-11 ENCOUNTER — Ambulatory Visit

## 2024-02-11 DIAGNOSIS — I428 Other cardiomyopathies: Secondary | ICD-10-CM

## 2024-02-12 LAB — CUP PACEART REMOTE DEVICE CHECK
Battery Remaining Longevity: 75 mo
Battery Voltage: 2.99 V
Brady Statistic AP VP Percent: 0.01 %
Brady Statistic AP VS Percent: 16.29 %
Brady Statistic AS VP Percent: 0.03 %
Brady Statistic AS VS Percent: 83.68 %
Brady Statistic RA Percent Paced: 16.26 %
Brady Statistic RV Percent Paced: 0.04 %
Date Time Interrogation Session: 20251014094547
HighPow Impedance: 74 Ohm
Implantable Lead Connection Status: 753985
Implantable Lead Connection Status: 753985
Implantable Lead Implant Date: 20210526
Implantable Lead Implant Date: 20210526
Implantable Lead Location: 753859
Implantable Lead Location: 753860
Implantable Lead Model: 5076
Implantable Pulse Generator Implant Date: 20210526
Lead Channel Impedance Value: 437 Ohm
Lead Channel Impedance Value: 437 Ohm
Lead Channel Impedance Value: 513 Ohm
Lead Channel Pacing Threshold Amplitude: 0.5 V
Lead Channel Pacing Threshold Amplitude: 0.625 V
Lead Channel Pacing Threshold Pulse Width: 0.4 ms
Lead Channel Pacing Threshold Pulse Width: 0.4 ms
Lead Channel Sensing Intrinsic Amplitude: 4.375 mV
Lead Channel Sensing Intrinsic Amplitude: 4.375 mV
Lead Channel Sensing Intrinsic Amplitude: 4.75 mV
Lead Channel Sensing Intrinsic Amplitude: 4.75 mV
Lead Channel Setting Pacing Amplitude: 1.5 V
Lead Channel Setting Pacing Amplitude: 2.5 V
Lead Channel Setting Pacing Pulse Width: 0.4 ms
Lead Channel Setting Sensing Sensitivity: 0.3 mV
Zone Setting Status: 755011
Zone Setting Status: 755011

## 2024-02-13 NOTE — Progress Notes (Signed)
 Remote ICD Transmission

## 2024-02-25 ENCOUNTER — Ambulatory Visit: Payer: Self-pay | Admitting: Cardiology

## 2024-03-07 ENCOUNTER — Other Ambulatory Visit (HOSPITAL_COMMUNITY): Payer: Self-pay | Admitting: Cardiology

## 2024-03-31 ENCOUNTER — Encounter

## 2024-05-12 ENCOUNTER — Ambulatory Visit

## 2024-05-12 DIAGNOSIS — I428 Other cardiomyopathies: Secondary | ICD-10-CM

## 2024-05-13 ENCOUNTER — Ambulatory Visit: Payer: Self-pay | Admitting: Cardiology

## 2024-05-13 LAB — CUP PACEART REMOTE DEVICE CHECK
Battery Remaining Longevity: 71 mo
Battery Voltage: 2.99 V
Brady Statistic AP VP Percent: 0.01 %
Brady Statistic AP VS Percent: 8.28 %
Brady Statistic AS VP Percent: 0.03 %
Brady Statistic AS VS Percent: 91.69 %
Brady Statistic RA Percent Paced: 8.24 %
Brady Statistic RV Percent Paced: 0.04 %
Date Time Interrogation Session: 20260113104458
HighPow Impedance: 69 Ohm
Implantable Lead Connection Status: 753985
Implantable Lead Connection Status: 753985
Implantable Lead Implant Date: 20210526
Implantable Lead Implant Date: 20210526
Implantable Lead Location: 753859
Implantable Lead Location: 753860
Implantable Lead Model: 5076
Implantable Pulse Generator Implant Date: 20210526
Lead Channel Impedance Value: 399 Ohm
Lead Channel Impedance Value: 437 Ohm
Lead Channel Impedance Value: 532 Ohm
Lead Channel Pacing Threshold Amplitude: 0.5 V
Lead Channel Pacing Threshold Amplitude: 0.625 V
Lead Channel Pacing Threshold Pulse Width: 0.4 ms
Lead Channel Pacing Threshold Pulse Width: 0.4 ms
Lead Channel Sensing Intrinsic Amplitude: 3.125 mV
Lead Channel Sensing Intrinsic Amplitude: 3.125 mV
Lead Channel Sensing Intrinsic Amplitude: 7 mV
Lead Channel Sensing Intrinsic Amplitude: 7 mV
Lead Channel Setting Pacing Amplitude: 1.5 V
Lead Channel Setting Pacing Amplitude: 2.5 V
Lead Channel Setting Pacing Pulse Width: 0.4 ms
Lead Channel Setting Sensing Sensitivity: 0.3 mV
Zone Setting Status: 755011
Zone Setting Status: 755011

## 2024-05-15 NOTE — Progress Notes (Signed)
 Remote ICD Transmission

## 2024-06-30 ENCOUNTER — Encounter

## 2024-07-25 ENCOUNTER — Ambulatory Visit (HOSPITAL_COMMUNITY): Admitting: Cardiology

## 2024-08-11 ENCOUNTER — Encounter

## 2024-09-29 ENCOUNTER — Encounter
